# Patient Record
Sex: Female | Born: 1970 | Race: Black or African American | Hispanic: No | Marital: Single | Smoking: Never smoker
Health system: Southern US, Community
[De-identification: ages and names within clinical notes are randomized; demographics above are authoritative.]

## PROBLEM LIST (undated history)

## (undated) DIAGNOSIS — Z8679 Personal history of other diseases of the circulatory system: Secondary | ICD-10-CM

## (undated) DIAGNOSIS — E049 Nontoxic goiter, unspecified: Secondary | ICD-10-CM

## (undated) DIAGNOSIS — Z8639 Personal history of other endocrine, nutritional and metabolic disease: Secondary | ICD-10-CM

## (undated) DIAGNOSIS — L509 Urticaria, unspecified: Secondary | ICD-10-CM

## (undated) DIAGNOSIS — G932 Benign intracranial hypertension: Secondary | ICD-10-CM

## (undated) HISTORY — DX: Personal history of other endocrine, nutritional and metabolic disease: Z86.39

## (undated) HISTORY — DX: Urticaria, unspecified: L50.9

## (undated) HISTORY — PX: CYST EXCISION: SHX5701

## (undated) HISTORY — PX: TONSILLECTOMY: SUR1361

## (undated) HISTORY — PX: ABDOMINAL HYSTERECTOMY: SUR658

## (undated) HISTORY — DX: Personal history of other diseases of the circulatory system: Z86.79

## (undated) HISTORY — PX: LUMBAR PUNCTURE: SHX1985

## (undated) HISTORY — DX: Benign intracranial hypertension: G93.2

## (undated) HISTORY — DX: Nontoxic goiter, unspecified: E04.9

## (undated) HISTORY — PX: MYOMECTOMY: SHX85

---

## 2005-03-09 ENCOUNTER — Emergency Department: Payer: Self-pay | Admitting: Emergency Medicine

## 2008-09-14 ENCOUNTER — Emergency Department: Payer: Self-pay | Admitting: Emergency Medicine

## 2009-12-13 IMAGING — CR DG LUMBAR SPINE 2-3V
1 series · 4 of 4 positions shown · non-contrast
Comparison: none

REASON FOR EXAM: pain
COMMENTS:

[Series 1: view not recorded · 0.17mm/px · 4 of 4 slices shown]
[im 1/4]
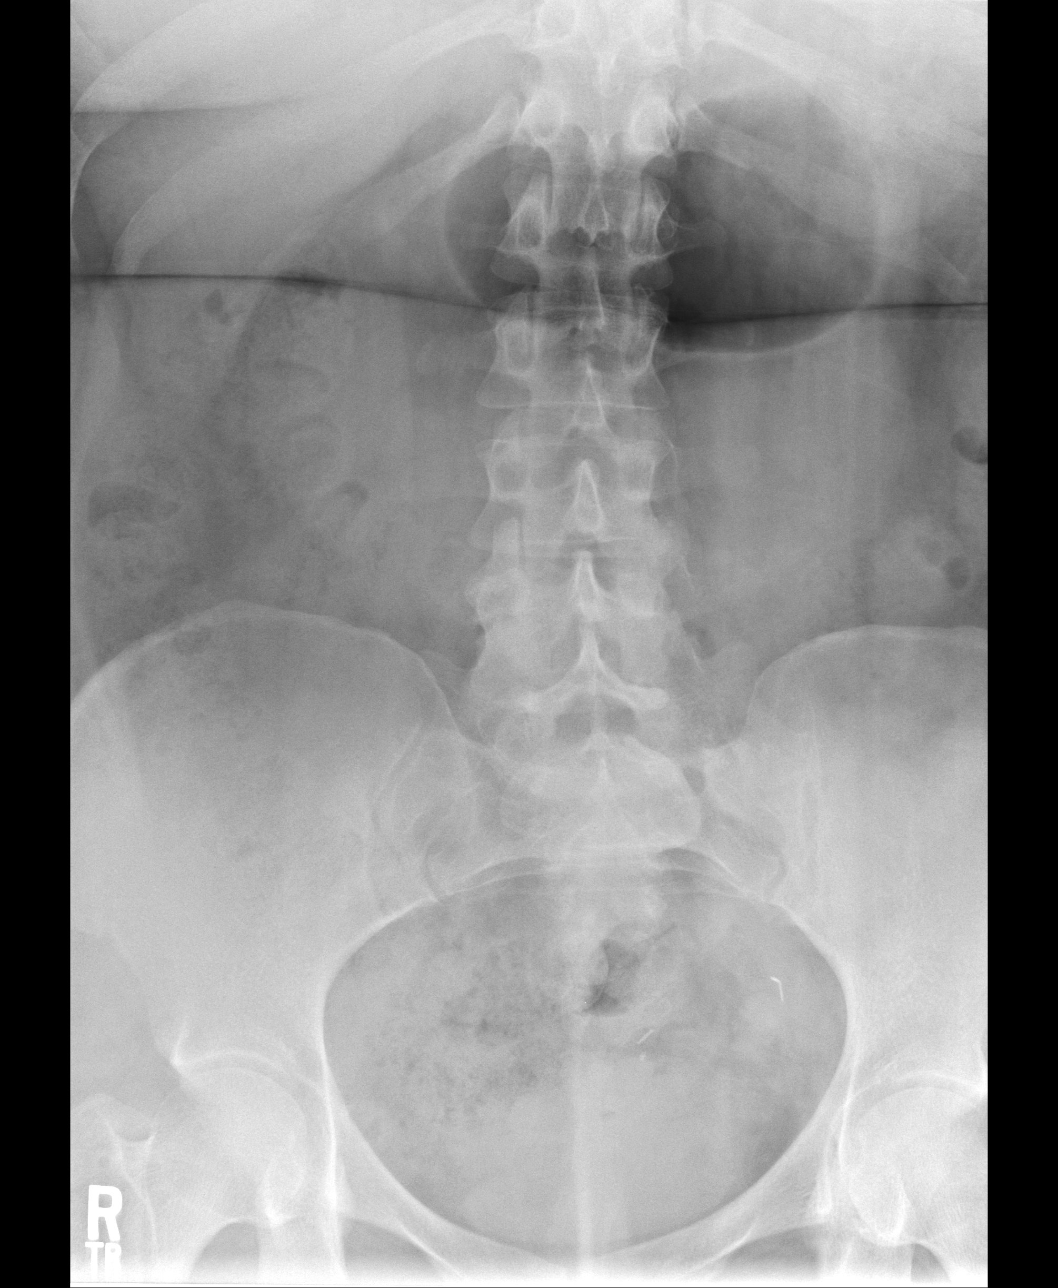
[im 2/4]
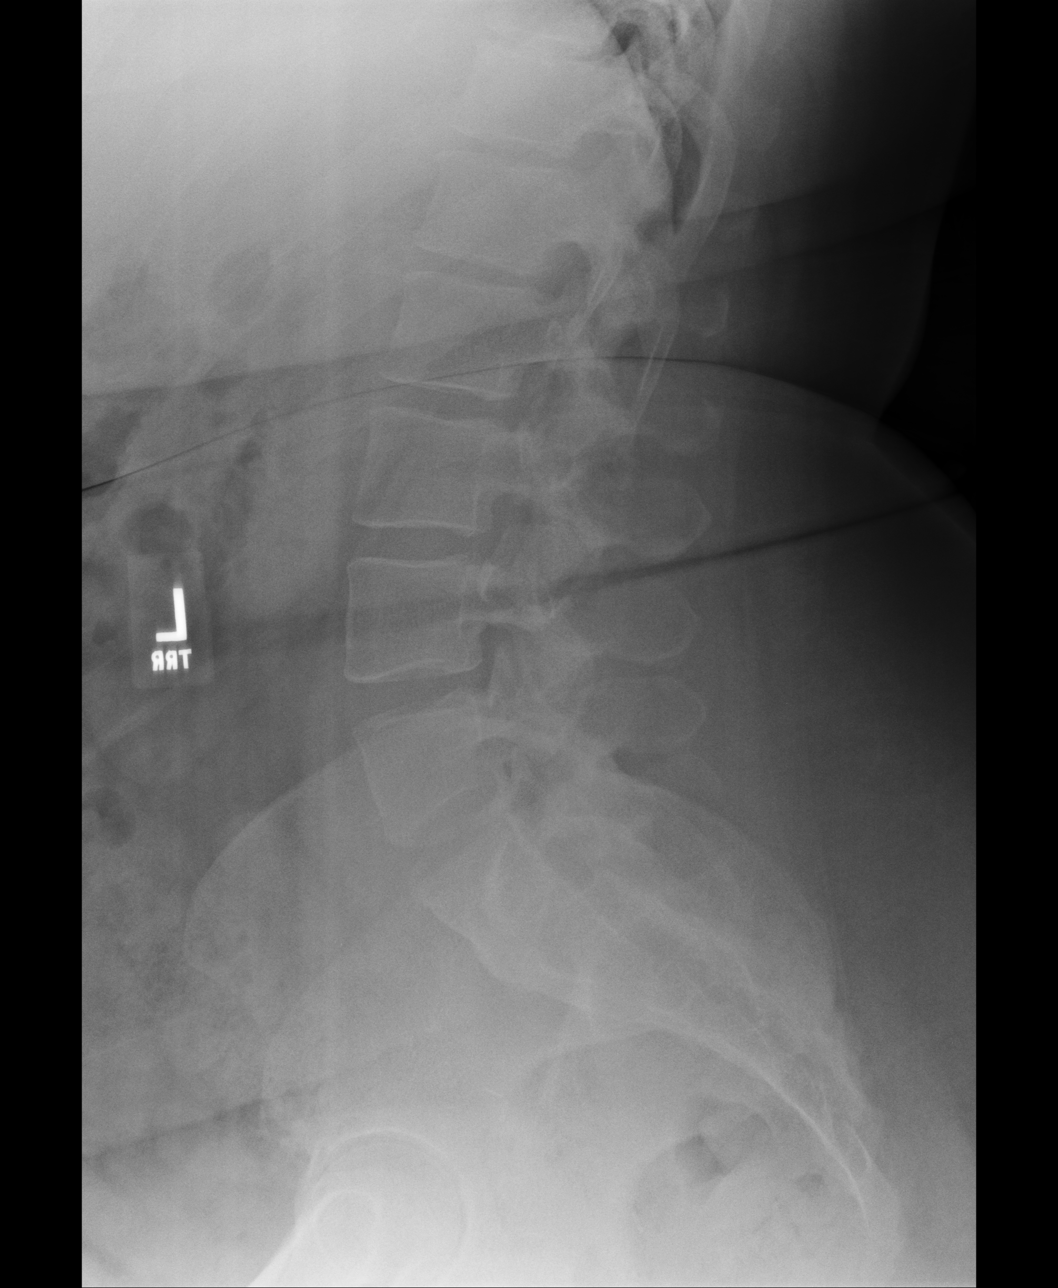
[im 3/4]
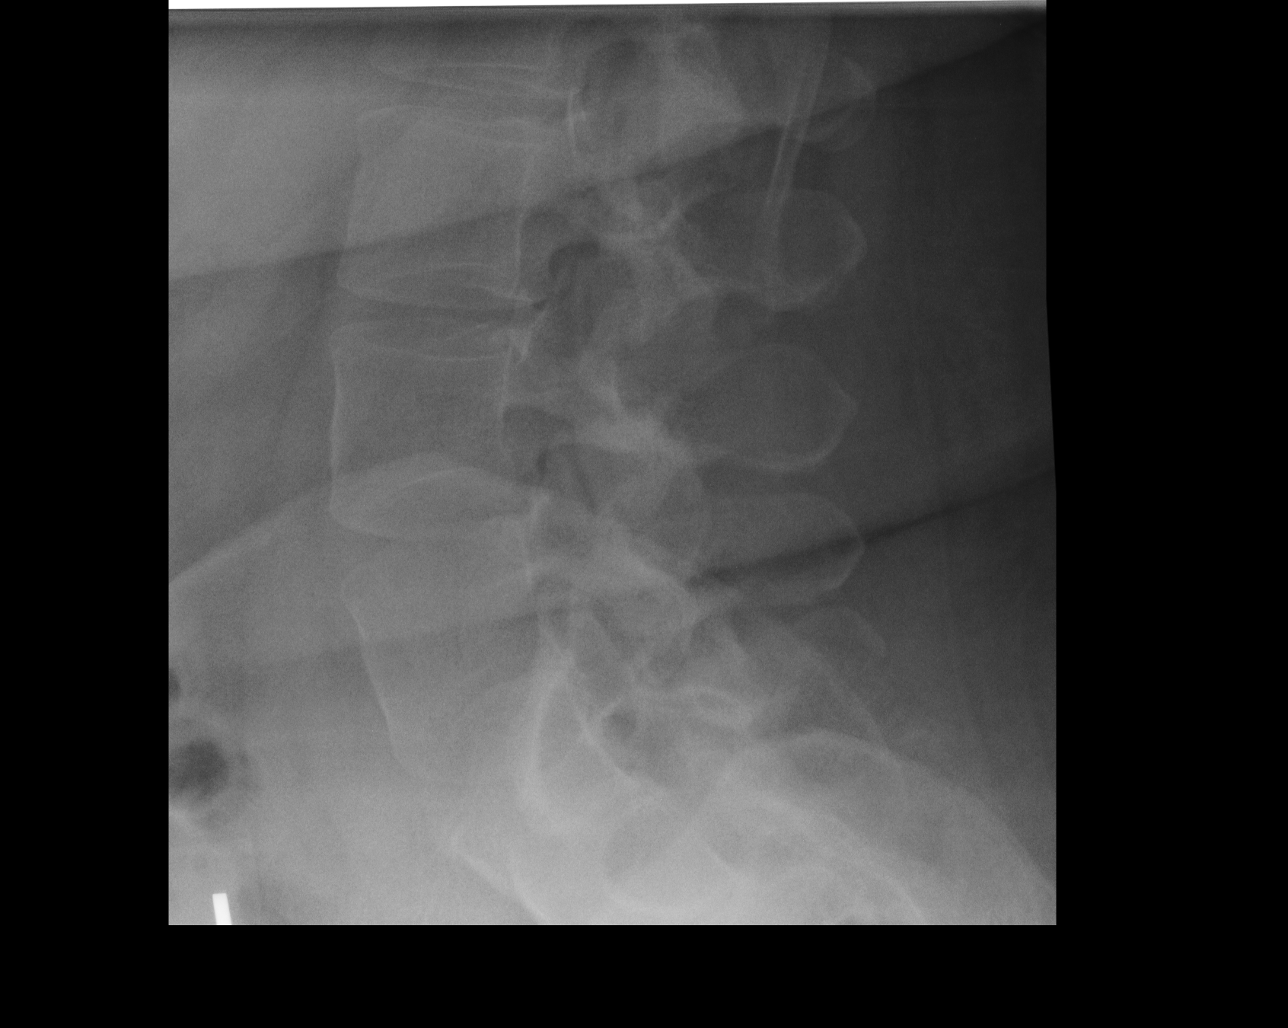
[im 4/4]
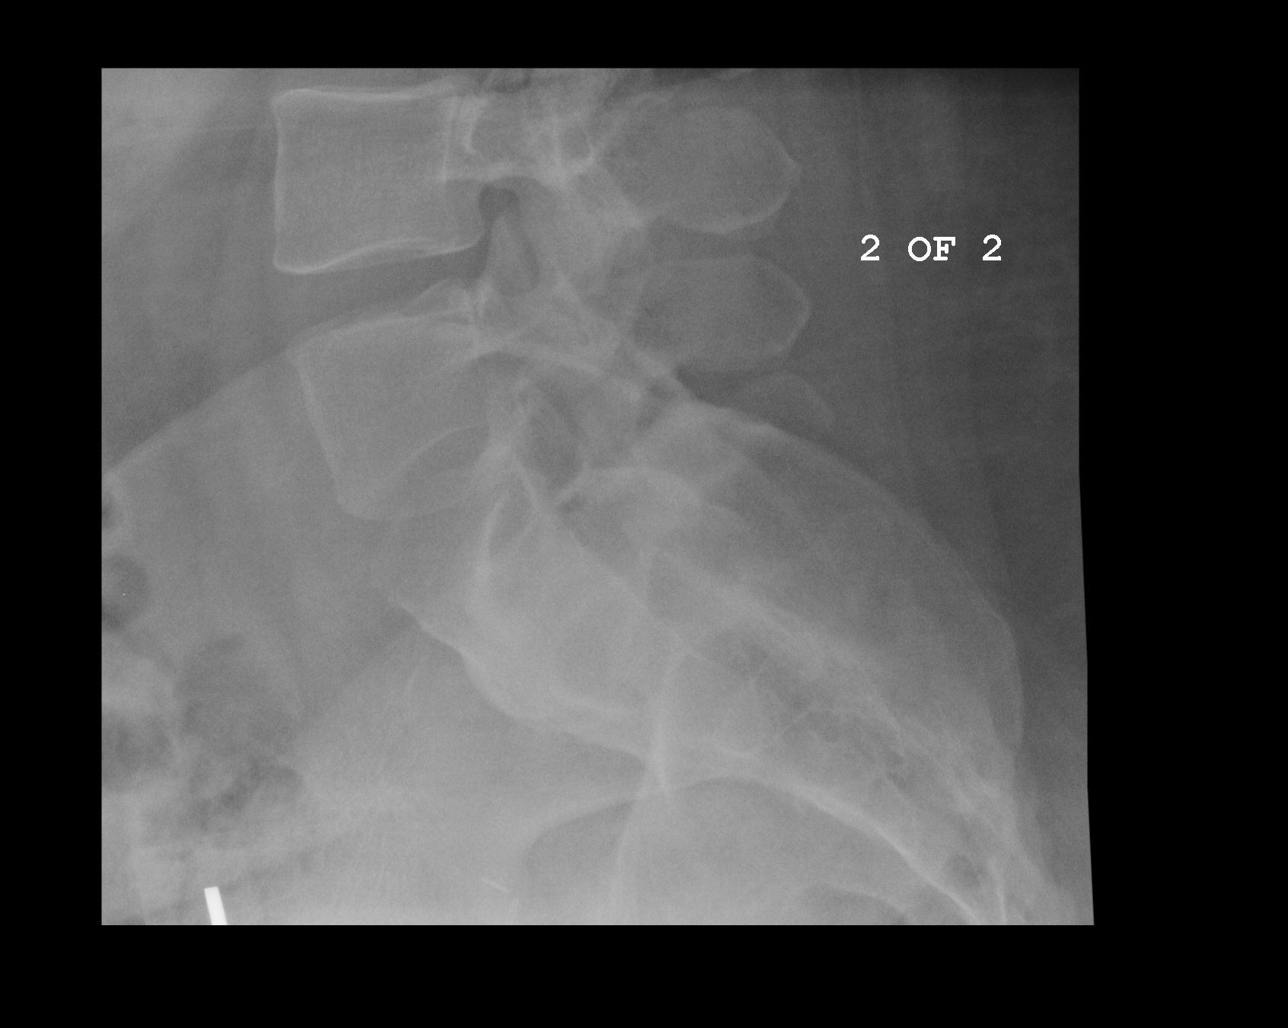

[4 of 4 positions shown; findings below may reference images not displayed]

PROCEDURE:     DXR - DXR LUMBAR SPINE AP AND LATERAL  - September 14, 2008  [DATE]

RESULT:     Five non rib bearing lumbar vertebral bodies are appreciated.
There does not appear to be evidence of fracture, dislocation or
malalignment.  Air is seen within nondilated loops of bowel.

If there is persistent clinical concern or persistent complaints of pain, a
repeat evaluation in 7-10 days is recommended if clinically warranted.
IMPRESSION: Unremarkable lumbar spine evaluation.

## 2011-02-17 ENCOUNTER — Other Ambulatory Visit: Payer: Self-pay | Admitting: *Deleted

## 2011-02-17 DIAGNOSIS — Z1231 Encounter for screening mammogram for malignant neoplasm of breast: Secondary | ICD-10-CM

## 2011-02-20 ENCOUNTER — Ambulatory Visit
Admission: RE | Admit: 2011-02-20 | Discharge: 2011-02-20 | Disposition: A | Discharge status: Home / Self Care | Payer: BC Managed Care – PPO | Source: Ambulatory Visit | Attending: *Deleted | Admitting: *Deleted

## 2011-02-20 DIAGNOSIS — Z1231 Encounter for screening mammogram for malignant neoplasm of breast: Secondary | ICD-10-CM

## 2012-12-11 ENCOUNTER — Emergency Department: Payer: Self-pay | Admitting: Unknown Physician Specialty

## 2012-12-11 LAB — COMPREHENSIVE METABOLIC PANEL
Albumin: 3.5 g/dL (ref 3.4–5.0)
Anion Gap: 6 — ABNORMAL LOW (ref 7–16)
Bilirubin,Total: 0.3 mg/dL (ref 0.2–1.0)
Calcium, Total: 8.6 mg/dL (ref 8.5–10.1)
Creatinine: 0.87 mg/dL (ref 0.60–1.30)
EGFR (Non-African Amer.): >60
Glucose: 349 mg/dL — ABNORMAL HIGH (ref 65–99)
Potassium: 4.2 mmol/L (ref 3.5–5.1)
SGOT(AST): 24 U/L (ref 15–37)

## 2012-12-11 LAB — TROPONIN I: Troponin-I: <0.02 ng/mL

## 2012-12-11 LAB — CBC
HCT: 39.2 % (ref 35.0–47.0)
HGB: 12.4 g/dL (ref 12.0–16.0)
MCHC: 31.6 g/dL — ABNORMAL LOW (ref 32.0–36.0)
MCV: 73 fL — ABNORMAL LOW (ref 80–100)
Platelet: 234 10*3/uL (ref 150–440)
RBC: 5.38 10*6/uL — ABNORMAL HIGH (ref 3.80–5.20)
RDW: 15.2 % — ABNORMAL HIGH (ref 11.5–14.5)

## 2012-12-11 LAB — URINALYSIS, COMPLETE
Bacteria: NONE SEEN
Bilirubin,UR: NEGATIVE
Glucose,UR: >=500 mg/dL (ref 0–75)
Ketone: NEGATIVE
Nitrite: NEGATIVE
Protein: NEGATIVE
RBC,UR: <1 /HPF (ref 0–5)
Specific Gravity: 1.022 (ref 1.003–1.030)

## 2012-12-11 LAB — MAGNESIUM: Magnesium: 1.6 mg/dL — ABNORMAL LOW

## 2012-12-11 LAB — LIPASE, BLOOD: Lipase: 156 U/L (ref 73–393)

## 2012-12-17 DIAGNOSIS — G4733 Obstructive sleep apnea (adult) (pediatric): Secondary | ICD-10-CM | POA: Insufficient documentation

## 2013-07-04 DIAGNOSIS — I10 Essential (primary) hypertension: Secondary | ICD-10-CM | POA: Insufficient documentation

## 2013-07-04 DIAGNOSIS — E739 Lactose intolerance, unspecified: Secondary | ICD-10-CM | POA: Insufficient documentation

## 2013-07-04 DIAGNOSIS — J309 Allergic rhinitis, unspecified: Secondary | ICD-10-CM | POA: Insufficient documentation

## 2013-07-04 DIAGNOSIS — E785 Hyperlipidemia, unspecified: Secondary | ICD-10-CM | POA: Insufficient documentation

## 2013-07-04 DIAGNOSIS — Z90710 Acquired absence of both cervix and uterus: Secondary | ICD-10-CM | POA: Insufficient documentation

## 2013-12-04 DIAGNOSIS — B001 Herpesviral vesicular dermatitis: Secondary | ICD-10-CM | POA: Insufficient documentation

## 2014-03-11 IMAGING — CR DG CHEST 2V
1 series · 2 of 2 positions shown · non-contrast
Comparison: none

REASON FOR EXAM: weakness
COMMENTS:

PROCEDURE:     DXR - DXR CHEST PA (OR AP) AND LATERAL  - December 11, 2012  [DATE]
RESULT:     Comparison: None

[Series 1: w chest pa · 0.14mm/px · 2 of 2 slices shown]
[im 1/2]
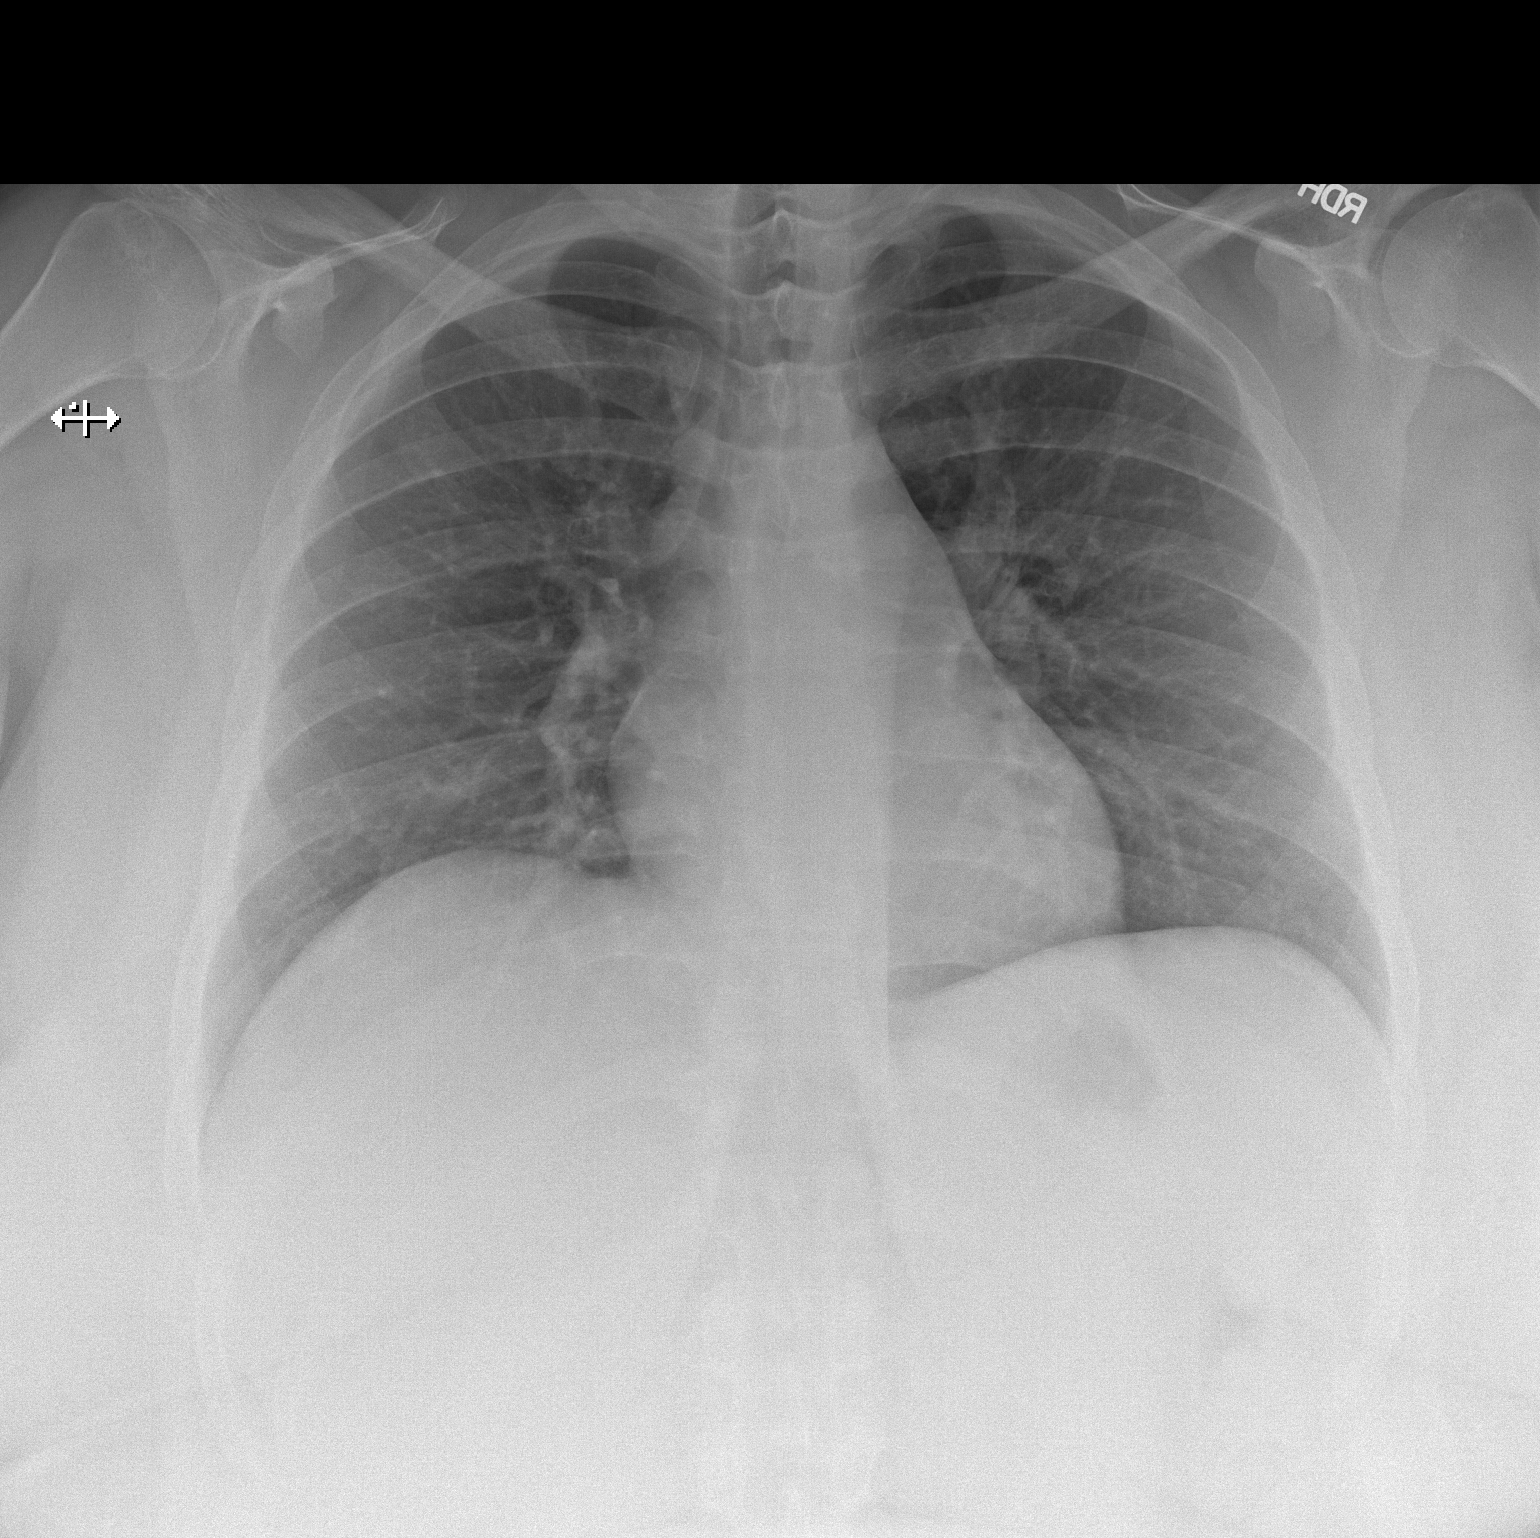
[im 2/2]
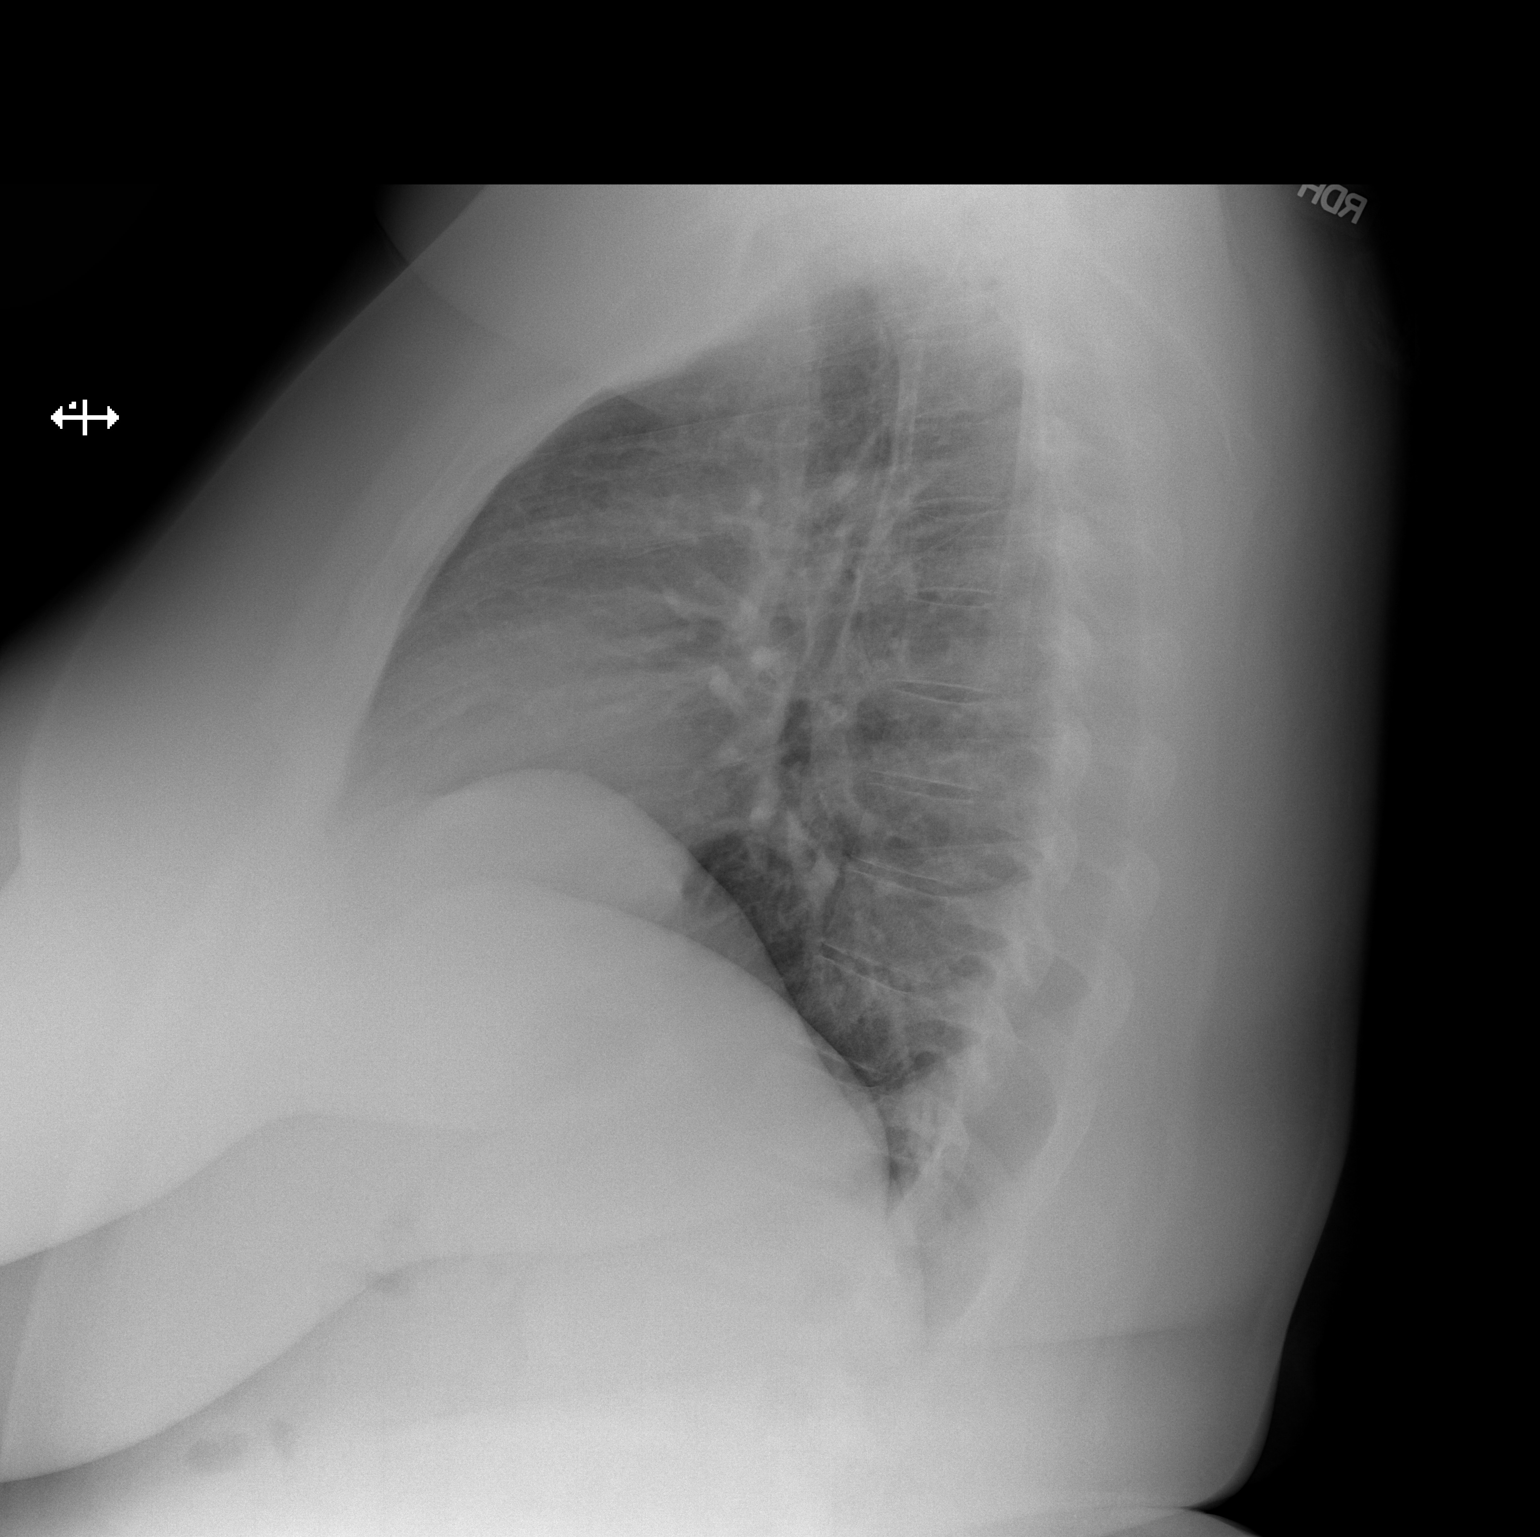

[2 of 2 positions shown; findings below may reference images not displayed]

FINDINGS: PA and lateral chest radiographs are provided.  There is no focal
parenchymal opacity, pleural effusion, or pneumothorax. The heart and
mediastinum are unremarkable.  The osseous structures are unremarkable.
IMPRESSION: No acute disease of the che[REDACTED]

## 2014-12-17 DIAGNOSIS — G932 Benign intracranial hypertension: Secondary | ICD-10-CM | POA: Insufficient documentation

## 2018-09-07 ENCOUNTER — Encounter: Payer: Self-pay | Admitting: Podiatry

## 2018-09-07 ENCOUNTER — Ambulatory Visit (INDEPENDENT_AMBULATORY_CARE_PROVIDER_SITE_OTHER): Payer: BC Managed Care – PPO

## 2018-09-07 ENCOUNTER — Other Ambulatory Visit: Payer: Self-pay | Admitting: Podiatry

## 2018-09-07 ENCOUNTER — Ambulatory Visit: Payer: BC Managed Care – PPO | Admitting: Podiatry

## 2018-09-07 DIAGNOSIS — M779 Enthesopathy, unspecified: Secondary | ICD-10-CM

## 2018-09-07 DIAGNOSIS — M205X1 Other deformities of toe(s) (acquired), right foot: Secondary | ICD-10-CM

## 2018-09-07 DIAGNOSIS — M778 Other enthesopathies, not elsewhere classified: Secondary | ICD-10-CM

## 2018-09-07 MED ORDER — MELOXICAM 15 MG PO TABS: 15.0000 mg | ORAL_TABLET | Freq: Every day | ORAL | 3 refills | Status: DC

## 2018-09-07 NOTE — Progress Notes (Signed)
Subjective:  Patient ID: Penny Fields, female    DOB: January 22, 1971,  MRN: [id number] HPI Chief Complaint  Patient presents with  . Foot Pain    patient presents today for bilat top of foot pain x 6-7 months.  She reports a shooting pain with walking and is progressivly getting worse.  She also reports her right hallux is stiff when bending it.  She has only been taking Tylenol for treatment    47 y.o. female presents with the above complaint.   ROS: Denies fever chills nausea vomiting muscle aches pains calf pain back pain chest pain shortness of breath.  No past medical history on file. No past surgical history on file.  Current Outpatient Medications:  .  aspirin EC 81 MG tablet, Take by mouth., Disp: , Rfl:  .  atorvastatin (LIPITOR) 20 MG tablet, Take by mouth., Disp: , Rfl:  .  Dextran 70-Hypromellose (ARTIFICIAL TEARS) 0.1-0.3 % SOLN, INSTILL 2 DROPS IN BOTH EYES THREE TIMES DAILY AS NEEDED FOR DRYNESS, Disp: , Rfl:  .  fexofenadine (ALLEGRA) 180 MG tablet, Take by mouth., Disp: , Rfl:  .  fluticasone (FLONASE) 50 MCG/ACT nasal spray, 2 sprays by Each Nare route daily., Disp: , Rfl:  .  liraglutide (VICTOZA) 18 MG/3ML SOPN, Inject into the skin., Disp: , Rfl:  .  lisinopril (PRINIVIL,ZESTRIL) 20 MG tablet, Take by mouth., Disp: , Rfl:  .  metformin (FORTAMET) 1000 MG (OSM) 24 hr tablet, Take by mouth., Disp: , Rfl:  .  valACYclovir (VALTREX) 1000 MG tablet, Take by mouth., Disp: , Rfl:  .  meloxicam (MOBIC) 15 MG tablet, Take 1 tablet (15 mg total) by mouth daily., Disp: 30 tablet, Rfl: 3  Allergies  Allergen Reactions  . Metronidazole Other (See Comments)    rash  . Oxycodone-Acetaminophen Rash  . Penicillins Rash   Review of Systems Objective:  There were no vitals filed for this visit.  General: Well developed, nourished, in no acute distress, alert and oriented x3   Dermatological: Skin is warm, dry and supple bilateral. Nails x 10 are well maintained; remaining  integument appears unremarkable at this time. There are no open sores, no preulcerative lesions, no rash or signs of infection present.  Vascular: Dorsalis Pedis artery and Posterior Tibial artery pedal pulses are 2/4 bilateral with immedate capillary fill time. Pedal hair growth present. No varicosities and no lower extremity edema present bilateral.   Neruologic: Grossly intact via light touch bilateral. Vibratory intact via tuning fork bilateral. Protective threshold with Semmes Wienstein monofilament intact to all pedal sites bilateral. Patellar and Achilles deep tendon reflexes 2+ bilateral. No Babinski or clonus noted bilateral.   Musculoskeletal: No gross boney pedal deformities bilateral. No pain, crepitus, or limitation noted with foot and ankle range of motion bilateral. Muscular strength 5/5 in all groups tested bilateral.  She has pain and tenderness across the dorsal aspect of the bilateral foot particularly Lisfranc's joints.  She has tenderness on end range of motion of the first metatarsophalangeal joint of the right foot limitation.  This is a hallux limitus.  Gait: Unassisted, Nonantalgic.    Radiographs:  Radiographs taken today demonstrate mild pes planus mild metatarsus adductus elongated first metatarsal some early joint space narrowing of the first metatarsophalangeal joint right foot as opposed to the left.  No acute findings noted.  Assessment & Plan:   Assessment: Hallux limitus right foot.  Midfoot osteoarthritic changes bilateral.  Plan: Discussed etiology pathology conservative surgical therapies at this point due  to the capsulitis and osteoarthritis we are requesting orthotics and I placed her on the one a day anti-inflammatory because of her elevated A1c.     Jamesyn Moorefield T. [place], [place]

## 2018-09-20 DIAGNOSIS — M205X1 Other deformities of toe(s) (acquired), right foot: Secondary | ICD-10-CM | POA: Insufficient documentation

## 2018-10-05 ENCOUNTER — Other Ambulatory Visit: Payer: BC Managed Care – PPO | Admitting: Orthotics

## 2018-10-19 ENCOUNTER — Ambulatory Visit (INDEPENDENT_AMBULATORY_CARE_PROVIDER_SITE_OTHER): Payer: BC Managed Care – PPO | Admitting: Podiatry

## 2018-10-19 ENCOUNTER — Encounter: Payer: Self-pay | Admitting: Podiatry

## 2018-10-19 DIAGNOSIS — M779 Enthesopathy, unspecified: Secondary | ICD-10-CM

## 2018-10-19 DIAGNOSIS — M778 Other enthesopathies, not elsewhere classified: Secondary | ICD-10-CM

## 2018-10-19 DIAGNOSIS — M205X1 Other deformities of toe(s) (acquired), right foot: Secondary | ICD-10-CM

## 2018-10-19 NOTE — Progress Notes (Signed)
She presents today to pick up orthotics saying that her feet are still bothering her.  Objective: No change in physical exam.  Assessment: Hallux limitus of the right foot with metatarsalgia.  Plan: She is provided with her orthotics and was provided with both oral and written home-going instructions for care and use of the orthotics.

## 2018-11-23 ENCOUNTER — Ambulatory Visit: Payer: BC Managed Care – PPO | Admitting: Podiatry

## 2018-11-23 ENCOUNTER — Encounter: Payer: Self-pay | Admitting: Podiatry

## 2018-11-23 DIAGNOSIS — M779 Enthesopathy, unspecified: Secondary | ICD-10-CM

## 2018-11-23 DIAGNOSIS — M778 Other enthesopathies, not elsewhere classified: Secondary | ICD-10-CM

## 2018-11-23 NOTE — Progress Notes (Signed)
She presents today for follow-up of her metatarsalgia and capsulitis of the lesser metatarsals and hallux limitus right foot.  She states that she is doing very well very happy with the orthotics and is much improved.  Objective: No reproducible pain on palpation today.  Pulses are palpable there is no erythema cellulitis drainage or odor.  Assessment: Well-healing metatarsalgia capsulitis lesser metatarsals right foot greater than left.  Plan: Continue the use of her orthotics.

## 2020-12-02 ENCOUNTER — Telehealth: Payer: Self-pay

## 2020-12-02 NOTE — Telephone Encounter (Signed)
I returned call to patient and informed her that she has not been seen in office in 2 years and we cannot call any pain medication in at this time.  She is scheduled for 12/23/20 with Dr. Al Corpus.  Until then I instructed her to try Advil 600mg  and alternate with ES Tylenol as needed for pain.  She did stated that today was not as painful as it had been.  She will call the office for sooner appt if pain becomes worse.  She verbalized instructions and understanding

## 2020-12-02 NOTE — Telephone Encounter (Signed)
Pt was last seen 11/23/2018 and she is having pain in her great toe. She would like to have pain meds sent to [facility]. Sending message per pts request. Appt scheduled 12/23/2020.

## 2020-12-23 ENCOUNTER — Ambulatory Visit: Payer: BC Managed Care – PPO | Admitting: Podiatry

## 2020-12-23 ENCOUNTER — Other Ambulatory Visit: Payer: Self-pay

## 2020-12-23 ENCOUNTER — Encounter: Payer: Self-pay | Admitting: Podiatry

## 2020-12-23 DIAGNOSIS — M778 Other enthesopathies, not elsewhere classified: Secondary | ICD-10-CM

## 2020-12-23 DIAGNOSIS — M109 Gout, unspecified: Secondary | ICD-10-CM | POA: Diagnosis not present

## 2020-12-23 DIAGNOSIS — M205X1 Other deformities of toe(s) (acquired), right foot: Secondary | ICD-10-CM | POA: Diagnosis not present

## 2020-12-23 MED ORDER — INDOMETHACIN 50 MG PO CAPS
50.0000 mg | ORAL_CAPSULE | Freq: Two times a day (BID) | ORAL | 2 refills | Status: DC
Start: 2020-12-23 — End: 2024-02-22

## 2020-12-23 NOTE — Progress Notes (Signed)
She presents today for follow-up of her hallux limitus capsulitis first metatarsophalangeal joint SM still having trouble with this I sometimes wear the orthotics depending on the shoes that I have.  She states that she had severe pain just the other day with severe inflammation red hot swollen joint states that her mother has a history of gout as well.  Objective: Vital signs are stable alert oriented x3.  Pulses are palpable.  No reproducible pain on palpation though she still has some postinflammatory hyperpigmentation to the medial aspect of the first metatarsophalangeal joint of the right foot.  There is no pain on range of motion of the first metatarsophalangeal joint nor on palpation.  There is noted limitation of the motion.  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  Plan: At this point we are requesting a complete metabolic panel and an arthritic panel should this come back abnormal we will notify her immediately.  I did go ahead and write her prescription for Indocin or indomethacin 50 mg tablets.  1 tablet twice daily as needed gout attack.  Should this patient request blood work such as an arthritic profile for gouty pain we are to provide this to her immediately.

## 2020-12-24 LAB — COMPREHENSIVE METABOLIC PANEL
ALT: 19 IU/L (ref 0–32)
AST: 20 IU/L (ref 0–40)
Albumin/Globulin Ratio: 1.4 (ref 1.2–2.2)
Albumin: 4.2 g/dL (ref 3.8–4.9)
Alkaline Phosphatase: 79 IU/L (ref 44–121)
BUN/Creatinine Ratio: 19 (ref 9–23)
BUN: 18 mg/dL (ref 6–24)
Bilirubin Total: <0.2 mg/dL (ref 0.0–1.2)
CO2: 21 mmol/L (ref 20–29)
Calcium: 9.4 mg/dL (ref 8.7–10.2)
Chloride: 101 mmol/L (ref 96–106)
Creatinine, Ser: 0.97 mg/dL (ref 0.57–1.00)
GFR calc Af Amer: 78 mL/min/{1.73_m2} (ref >59)
GFR calc non Af Amer: 68 mL/min/{1.73_m2} (ref >59)
Globulin, Total: 3.1 g/dL (ref 1.5–4.5)
Glucose: 100 mg/dL — ABNORMAL HIGH (ref 65–99)
Potassium: 5.1 mmol/L (ref 3.5–5.2)
Sodium: 137 mmol/L (ref 134–144)
Total Protein: 7.3 g/dL (ref 6.0–8.5)

## 2020-12-24 LAB — RHEUMATOID FACTOR: Rheumatoid fact SerPl-aCnc: <10 IU/mL (ref <14.0)

## 2020-12-24 LAB — ANA: Anti Nuclear Antibody (ANA): NEGATIVE

## 2020-12-24 LAB — SEDIMENTATION RATE: Sed Rate: 39 mm/hr (ref 0–40)

## 2020-12-24 LAB — URIC ACID: Uric Acid: 7.5 mg/dL — ABNORMAL HIGH (ref 3.0–7.2)

## 2020-12-24 LAB — C-REACTIVE PROTEIN: CRP: 9 mg/L (ref 0–10)

## 2021-01-03 ENCOUNTER — Telehealth: Payer: Self-pay

## 2021-01-03 MED ORDER — ALLOPURINOL 100 MG PO TABS: 100.0000 mg | ORAL_TABLET | Freq: Every day | ORAL | 3 refills | Status: DC

## 2021-01-03 NOTE — Telephone Encounter (Signed)
Tried to contact patient but voice mail has not been set up yet so unable to leave message.  Allopurinol has been sent into pharmacy

## 2021-01-03 NOTE — Telephone Encounter (Signed)
-----   Message from Elinor Parkinson, [place] sent at 12/24/2020  1:23 PM EST ----- Her uric acid is slightly elevated.  I think putting her on allopurinol 100mg  qd would help.  #90 with 3 rf.  Make follow up for two months after starting to see me.  We will redo blood work then. ----- Message ----- From: Interface, [facility] Lab Results In Sent: 12/24/2020   7:37 AM EST To: 12/26/2020, DPM

## 2022-11-13 NOTE — Progress Notes (Signed)
Office Visit Note  Patient: Penny Fields             Date of Birth: 05/07/1971           MRN: [id number]             PCP: Roselyn Meier, MD Referring: Dion Body, MD Visit Date: 11/27/2022 Occupation: @GUAROCC @  Subjective:  Pain in both knees  History of Present Illness: Penny Fields is a 52 y.o. female with history of osteoarthritis.  She has been seen in consultation per request of her PCP.  According the patient her symptoms started with pain and discomfort in her bilateral knee joints and 2010.  She recalls coming to see me in 2011 she states at that time we diagnosed her with osteoarthritis based on the x-rays.  Patient recalls that she had  viscosupplement injections which were helpful.  Over the next few years her symptoms were manageable.  She states gradually the symptoms have been getting worse and she is having difficulty climbing stairs.  She was having increased pain in her knee joints which improved her to some extent after defining her diet.  She also had some discomfort in her right first MTP joint for which she was seen by podiatrist.  She was advised proper fitting shoes.  None of the other joints are painful or swollen.  There is history of osteoarthritis in her both parents.  She is gravida 0, para 0.    Activities of Daily Living:  Patient reports morning stiffness for a few minutes.   Patient Denies nocturnal pain.  Difficulty dressing/grooming: Denies Difficulty climbing stairs: Denies Difficulty getting out of chair: Denies Difficulty using hands for taps, buttons, cutlery, and/or writing: Denies  Review of Systems  Constitutional:  Negative for fatigue.  HENT:  Negative for mouth sores and mouth dryness.   Eyes:  Positive for dryness.  Respiratory:  Negative for shortness of breath.   Cardiovascular:  Negative for chest pain and palpitations.  Gastrointestinal:  Negative for blood in stool, constipation and diarrhea.  Endocrine: Negative for increased  urination.  Genitourinary:  Negative for involuntary urination.  Musculoskeletal:  Positive for joint pain, joint pain and morning stiffness. Negative for gait problem, joint swelling, myalgias, muscle weakness, muscle tenderness and myalgias.  Skin:  Negative for color change, rash, hair loss and sensitivity to sunlight.  Allergic/Immunologic: Negative for susceptible to infections.  Neurological:  Negative for dizziness and headaches.  Hematological:  Negative for swollen glands.  Psychiatric/Behavioral:  Negative for depressed mood and sleep disturbance. The patient is not nervous/anxious.     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   Allergic rhinitis 07/04/2013   Essential hypertension 07/04/2013   Hyperlipidemia 07/04/2013   Lactose intolerance 07/04/2013   S/P total abdominal hysterectomy and bilateral salpingo-oophorectomy 07/04/2013   Obstructive sleep apnea 12/17/2012   Diabetes mellitus ([place]) 12/24/2009   Goiter 06/05/1997    Past Medical History:  Diagnosis Date   Goiter    History of diabetes mellitus    History of hyperlipidemia    History of hypertension    Idiopathic intracranial hypertension     Family History  Problem Relation Age of Onset   Diabetes Mother    Hypertension Mother    Kidney disease Mother    Hypertension Father  Diabetes Father    Heart failure Father    Hypertension Sister    Diabetes Sister    Past Surgical History:  Procedure Laterality Date   ABDOMINAL HYSTERECTOMY     CYST EXCISION     on back   LUMBAR PUNCTURE     MYOMECTOMY     TONSILLECTOMY     72s   Social History   Social History Narrative   Not on file   Immunization History  Administered Date(s) Administered   Influenza,inj,Quad PF,6+ Mos 08/07/2019   Influenza,inj,quad,  With Preservative 08/17/2018   Influenza-Unspecified 08/23/2014, 08/13/2016, 08/23/2017   PFIZER(Purple Top)SARS-COV-2 Vaccination 01/23/2020, 02/20/2020   Pneumococcal Polysaccharide-23 08/10/2013   Typhoid Inactivated 10/04/2006     Objective: Vital Signs: BP 132/85 (BP Location: Right Arm, Patient Position: Sitting, Cuff Size: Normal)   Pulse 80   Resp 16   Ht 5' 7.5" (1.715 m)   Wt 290 lb 12.8 oz (131.9 kg)   BMI 44.87 kg/m    Physical Exam Vitals and nursing note reviewed.  Constitutional:      Appearance: She is well-developed.  HENT:     Head: Normocephalic and atraumatic.  Eyes:     Conjunctiva/sclera: Conjunctivae normal.  Cardiovascular:     Rate and Rhythm: Normal rate and regular rhythm.     Heart sounds: Normal heart sounds.  Pulmonary:     Effort: Pulmonary effort is normal.     Breath sounds: Normal breath sounds.  Abdominal:     General: Bowel sounds are normal.     Palpations: Abdomen is soft.  Musculoskeletal:     Cervical back: Normal range of motion.  Lymphadenopathy:     Cervical: No cervical adenopathy.  Skin:    General: Skin is warm and dry.     Capillary Refill: Capillary refill takes less than 2 seconds.  Neurological:     Mental Status: She is alert and oriented to person, place, and time.  Psychiatric:        Behavior: Behavior normal.      Musculoskeletal Exam: Cervical, thoracic and lumbar spine were in good range of motion.  She had no SI joint tenderness.  Shoulder joints, elbow joints, wrist joints, MCPs PIPs and DIPs with good range of motion.  She had right first PIP thickening and some tenderness.  No synovitis was noted.  Hip joints and knee joints in good range of motion.  No warmth swelling or effusion was noted.  There was no tenderness over ankles or MTPs.  She had right hallux rigidus and PIP and DIP thickening.  No synovitis was noted.  There was no Achilles tendinitis of [place] fasciitis.  CDAI Exam: CDAI Score:  -- Patient Global: --; Provider Global: -- Swollen: --; Tender: -- Joint Exam 11/27/2022   No joint exam has been documented for this visit   There is currently no information documented on the homunculus. Go to the Rheumatology activity and complete the homunculus joint exam.  Investigation: No additional findings.  Imaging: XR KNEE 3 VIEW LEFT  Result Date: 11/27/2022 No medial or lateral compartment narrowing was noted.  No chondrocalcinosis was noted.  Moderate patellofemoral narrowing was noted. Impression: These findings are consistent with moderate patellofemoral narrowing  XR KNEE 3 VIEW RIGHT  Result Date: 11/27/2022 No medial or lateral compartment narrowing was noted.  No chondrocalcinosis was noted.  Moderate patellofemoral narrowing was noted. Impression: These findings are consistent with moderate patellofemoral narrowing   Recent Labs: Lab Results  Component Value  Date   WBC 5.9 12/11/2012   HGB 12.4 12/11/2012   PLT 234 12/11/2012   NA 137 12/23/2020   K 5.1 12/23/2020   CL 101 12/23/2020   CO2 21 12/23/2020   GLUCOSE 100 (H) 12/23/2020   BUN 18 12/23/2020   CREATININE 0.97 12/23/2020   BILITOT <0.2 12/23/2020   ALKPHOS 79 12/23/2020   AST 20 12/23/2020   ALT 19 12/23/2020   PROT 7.3 12/23/2020   ALBUMIN 4.2 12/23/2020   CALCIUM 9.4 12/23/2020   GFRAA 78 12/23/2020   December 23, 2020 uric acid 7.5, ESR 39, RF negative, ANA negative, CRP normal  Speciality Comments: No specialty comments available.  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  She states that she has seen me before in 2011 and had viscosupplement injections to her knee joints which were helpful.  Gradually the knee joint pain has returned.  She is having difficulty climbing  stairs.  She states she also noticed some inflammation in her knee joints which improved after dietary modifications.  She is also trying intentional weight loss.  She has not been able to exercise recently.  She usually walks for exercise.  No warmth swelling or effusion was noted.  Labs from December 23, 2020 were reviewed.  All autoimmune workup was negative.  Sed rate was normal.  Plan: XR KNEE 3 VIEW RIGHT, XR KNEE 3 VIEW LEFT.  X-rays of bilateral knee joints showed chondromalacia patella.  No significant osteoarthritis was noted.  X-ray findings were reviewed with the patient.  She will benefit from physical therapy.  I will refer her to physical therapy.  A handout on knee joint exercises was given.  I advised her to return for follow-up on as needed basis.  She will contact me if her symptoms get worse or she develops increased swelling.  Weight loss diet and exercise was also emphasized.  Hallux limitus of right foot-patient was evaluated by Dr. Al Corpus in the past due to feet discomfort.  She was diagnosed with hallux limitus.  Primary osteoarthritis of both feet-she had bilateral first MTP thickening and PIP and DIP thickening on the examination consistent with osteoarthritis.  Proper fitting shoes and feet exercises were discussed.  Primary osteoarthritis of both hands-she has arthritis in the right first PIP joint.  She had mild tenderness on the palpation.  Joint protection muscle strengthening was discussed.  A handout on exercises was given.  Essential hypertension-blood pressure was normal today.  History of diabetes mellitus-she is on metformin.  History of hyperlipidemia-she is on Lipitor.  Goiter  Idiopathic intracranial hypertension  Obstructive sleep apnea  S/P total abdominal hysterectomy and bilateral salpingo-oophorectomy  Orders: Orders Placed This Encounter  Procedures   XR KNEE 3 VIEW RIGHT   XR KNEE 3 VIEW LEFT   Ambulatory referral to Physical Therapy   No  orders of the defined types were placed in this encounter.    Follow-Up Instructions: Return if symptoms worsen or fail to improve, for Osteoarthritis.   Pollyann Savoy, MD  Note - This record has been created using Animal nutritionist.  Chart creation errors have been sought, but may not always  have been located. Such creation errors do not reflect on  the standard of medical care.

## 2022-11-27 ENCOUNTER — Ambulatory Visit (INDEPENDENT_AMBULATORY_CARE_PROVIDER_SITE_OTHER): Payer: BC Managed Care – PPO

## 2022-11-27 ENCOUNTER — Ambulatory Visit: Payer: BC Managed Care – PPO | Attending: Rheumatology | Admitting: Rheumatology

## 2022-11-27 ENCOUNTER — Encounter: Payer: Self-pay | Admitting: Rheumatology

## 2022-11-27 ENCOUNTER — Ambulatory Visit: Payer: BC Managed Care – PPO

## 2022-11-27 VITALS — BP 132/85 | HR 80 | Resp 16 | Ht 67.5 in | Wt 290.8 lb

## 2022-11-27 DIAGNOSIS — M2242 Chondromalacia patellae, left knee: Secondary | ICD-10-CM

## 2022-11-27 DIAGNOSIS — M2241 Chondromalacia patellae, right knee: Secondary | ICD-10-CM | POA: Diagnosis not present

## 2022-11-27 DIAGNOSIS — M19071 Primary osteoarthritis, right ankle and foot: Secondary | ICD-10-CM

## 2022-11-27 DIAGNOSIS — M170 Bilateral primary osteoarthritis of knee: Secondary | ICD-10-CM

## 2022-11-27 DIAGNOSIS — I10 Essential (primary) hypertension: Secondary | ICD-10-CM

## 2022-11-27 DIAGNOSIS — M19041 Primary osteoarthritis, right hand: Secondary | ICD-10-CM | POA: Diagnosis not present

## 2022-11-27 DIAGNOSIS — M19042 Primary osteoarthritis, left hand: Secondary | ICD-10-CM

## 2022-11-27 DIAGNOSIS — Z8639 Personal history of other endocrine, nutritional and metabolic disease: Secondary | ICD-10-CM

## 2022-11-27 DIAGNOSIS — M19072 Primary osteoarthritis, left ankle and foot: Secondary | ICD-10-CM

## 2022-11-27 DIAGNOSIS — Z90722 Acquired absence of ovaries, bilateral: Secondary | ICD-10-CM

## 2022-11-27 DIAGNOSIS — Z9079 Acquired absence of other genital organ(s): Secondary | ICD-10-CM

## 2022-11-27 DIAGNOSIS — G4733 Obstructive sleep apnea (adult) (pediatric): Secondary | ICD-10-CM

## 2022-11-27 DIAGNOSIS — G932 Benign intracranial hypertension: Secondary | ICD-10-CM

## 2022-11-27 DIAGNOSIS — E049 Nontoxic goiter, unspecified: Secondary | ICD-10-CM

## 2022-11-27 DIAGNOSIS — Z90710 Acquired absence of both cervix and uterus: Secondary | ICD-10-CM

## 2022-11-27 DIAGNOSIS — M205X1 Other deformities of toe(s) (acquired), right foot: Secondary | ICD-10-CM

## 2022-11-27 NOTE — Patient Instructions (Signed)
Exercises for Chronic Knee Pain Chronic knee pain is pain that lasts longer than 3 months. For most people with chronic knee pain, exercise and weight loss is an important part of treatment. Your health care provider may want you to focus on: Strengthening the muscles that support your knee. This can take pressure off your knee and lessen pain. Preventing knee stiffness. Maintaining or increasing how far you can move your knee. Losing weight (if this applies) to take pressure off your knee, decrease your risk for injury, and make it easier for you to exercise. Your health care provider will help you develop an exercise program that matches your needs and physical abilities. Below are simple, low-impact exercises you can do at home. Ask your health care provider or a physical therapist how often you should do your exercise program and how many times to repeat each exercise. General safety tips Follow these safety tips for exercising with chronic knee pain: Get your health care provider's approval before doing any exercises. Start slowly and stop any time an exercise causes pain. Do not exercise if your knee pain is flaring up. Warm up first. Stretching a cold muscle can cause an injury. Do 5-10 minutes of easy movement or light stretching before beginning your exercise routine. Do 5-10 minutes of low-impact activity (like walking or cycling) before starting strengthening exercises. Contact your health care provider any time you have pain during or after exercising. Exercise may cause discomfort but should not be painful. It is normal to be a little stiff or sore after exercising.  Stretching and range-of-motion exercises Front thigh stretch  Stand up straight and support your body by holding on to a chair or resting one hand on a wall. With your legs straight and close together, bend one knee to lift your heel up toward your buttocks. Using one hand for support, grab your ankle with your free  hand. Pull your foot up closer toward your buttocks to feel the stretch in front of your thigh. Hold the stretch for 30 seconds. Repeat __________ times. Complete this exercise __________ times a day. Back thigh stretch  Sit on the floor with your back straight and your legs out straight in front of you. Place the palms of your hands on the floor and slide them toward your feet as you bend at the hip. Try to touch your nose to your knees and feel the stretch in the back of your thighs. Hold for 30 seconds. Repeat __________ times. Complete this exercise __________ times a day. Calf stretch  Stand facing a wall. Place the palms of your hands flat against the wall, arms extended, and lean slightly against the wall. Get into a lunge position with one leg bent at the knee and the other leg stretched out straight behind you. Keep both feet facing the wall and increase the bend in your knee while keeping the heel of the other leg flat on the ground. You should feel the stretch in your calf. Hold for 30 seconds. Repeat __________ times. Complete this exercise __________ times a day. Strengthening exercises Straight leg lift Lie on your back with one knee bent and the other leg out straight. Slowly lift the straight leg without bending the knee. Lift until your foot is about 12 inches (30 cm) off the floor. Hold for 3-5 seconds and slowly lower your leg. Repeat __________ times. Complete this exercise __________ times a day. Single leg dip Stand between two chairs and put both hands on the  backs of the chairs for support. Extend one leg out straight with your body weight resting on the heel of the standing leg. Slowly bend your standing knee to dip your body to the level that is comfortable for you. Hold for 3-5 seconds. Repeat __________ times. Complete this exercise __________ times a day. Hamstring curls Stand straight, knees close together, facing the back of a chair. Hold on to the  back of a chair with both hands. Keep one leg straight. Bend the other knee while bringing the heel up toward the buttock until the knee is bent at a 90-degree angle (right angle). Hold for 3-5 seconds. Repeat __________ times. Complete this exercise __________ times a day. Wall squat Stand straight with your back, hips, and head against a wall. Step forward one foot at a time with your back still against the wall. Your feet should be 2 feet (61 cm) from the wall at shoulder width. Keeping your back, hips, and head against the wall, slide down the wall to as close of a sitting position as you can get. Hold for 5-10 seconds, then slowly slide back up. Repeat __________ times. Complete this exercise __________ times a day. Step-ups Step up with one foot onto a sturdy platform or stool that is about 6 inches (15 cm) high. Face sideways with one foot on the platform and one on the ground. Place all your weight on the platform foot and lift your body off the ground until your knee extends. Let your other leg hang free to the side. Hold for 3-5 seconds then slowly lower your weight down to the floor foot. Repeat __________ times. Complete this exercise __________ times a day. Contact a health care provider if: Your exercise causes pain. Your pain is worse after you exercise. Your pain prevents you from doing your exercises. This information is not intended to replace advice given to you by your health care provider. Make sure you discuss any questions you have with your health care provider. Document Revised: 02/29/2020 Document Reviewed: 10/23/2019 [employer] Patient Education  2023 [employer]  Hand Exercises Hand exercises can be helpful for almost anyone. These exercises can strengthen the hands, improve flexibility and movement, and increase blood flow to the hands. These results can make work and daily tasks easier. Hand exercises can be especially helpful for people who have joint pain  from arthritis or have nerve damage from overuse (carpal tunnel syndrome). These exercises can also help people who have injured a hand. Exercises Most of these hand exercises are gentle stretching and motion exercises. It is usually safe to do them often throughout the day. Warming up your hands before exercise may help to reduce stiffness. You can do this with gentle massage or by placing your hands in warm water for 10-15 minutes. It is normal to feel some stretching, pulling, tightness, or mild discomfort as you begin new exercises. This will gradually improve. Stop an exercise right away if you feel sudden, severe pain or your pain gets worse. Ask your health care provider which exercises are best for you. Knuckle bend or "claw" fist  Stand or sit with your arm, hand, and all five fingers pointed straight up. Make sure to keep your wrist straight during the exercise. Gently bend your fingers down toward your palm until the tips of your fingers are touching the top of your palm. Keep your big knuckle straight and just bend the small knuckles in your fingers. Hold this position for __________ seconds. Straighten (  extend) your fingers back to the starting position. Repeat this exercise 5-10 times with each hand. Full finger fist  Stand or sit with your arm, hand, and all five fingers pointed straight up. Make sure to keep your wrist straight during the exercise. Gently bend your fingers into your palm until the tips of your fingers are touching the middle of your palm. Hold this position for __________ seconds. Extend your fingers back to the starting position, stretching every joint fully. Repeat this exercise 5-10 times with each hand. Straight fist Stand or sit with your arm, hand, and all five fingers pointed straight up. Make sure to keep your wrist straight during the exercise. Gently bend your fingers at the big knuckle, where your fingers meet your hand, and the middle knuckle. Keep  the knuckle at the tips of your fingers straight and try to touch the bottom of your palm. Hold this position for __________ seconds. Extend your fingers back to the starting position, stretching every joint fully. Repeat this exercise 5-10 times with each hand. Tabletop  Stand or sit with your arm, hand, and all five fingers pointed straight up. Make sure to keep your wrist straight during the exercise. Gently bend your fingers at the big knuckle, where your fingers meet your hand, as far down as you can while keeping the small knuckles in your fingers straight. Think of forming a tabletop with your fingers. Hold this position for __________ seconds. Extend your fingers back to the starting position, stretching every joint fully. Repeat this exercise 5-10 times with each hand. Finger spread  Place your hand flat on a table with your palm facing down. Make sure your wrist stays straight as you do this exercise. Spread your fingers and thumb apart from each other as far as you can until you feel a gentle stretch. Hold this position for __________ seconds. Bring your fingers and thumb tight together again. Hold this position for __________ seconds. Repeat this exercise 5-10 times with each hand. Making circles  Stand or sit with your arm, hand, and all five fingers pointed straight up. Make sure to keep your wrist straight during the exercise. Make a circle by touching the tip of your thumb to the tip of your index finger. Hold for __________ seconds. Then open your hand wide. Repeat this motion with your thumb and each finger on your hand. Repeat this exercise 5-10 times with each hand. Thumb motion  Sit with your forearm resting on a table and your wrist straight. Your thumb should be facing up toward the ceiling. Keep your fingers relaxed as you move your thumb. Lift your thumb up as high as you can toward the ceiling. Hold for __________ seconds. Bend your thumb across your palm as far  as you can, reaching the tip of your thumb for the small finger (pinkie) side of your palm. Hold for __________ seconds. Repeat this exercise 5-10 times with each hand. Grip strengthening  Hold a stress ball or other soft ball in the middle of your hand. Slowly increase the pressure, squeezing the ball as much as you can without causing pain. Think of bringing the tips of your fingers into the middle of your palm. All of your finger joints should bend when doing this exercise. Hold your squeeze for __________ seconds, then relax. Repeat this exercise 5-10 times with each hand. Contact a health care provider if: Your hand pain or discomfort gets much worse when you do an exercise. Your hand pain or   discomfort does not improve within 2 hours after you exercise. If you have any of these problems, stop doing these exercises right away. Do not do them again unless your health care provider says that you can. Get help right away if: You develop sudden, severe hand pain or swelling. If this happens, stop doing these exercises right away. Do not do them again unless your health care provider says that you can. This information is not intended to replace advice given to you by your health care provider. Make sure you discuss any questions you have with your health care provider. Document Revised: 02/13/2021 Document Reviewed: 02/13/2021 Elsevier Patient Education  2023 [employer]  

## 2022-12-18 ENCOUNTER — Ambulatory Visit: Payer: Self-pay | Admitting: Rheumatology

## 2023-01-15 ENCOUNTER — Encounter: Payer: Self-pay | Admitting: Physical Therapy

## 2023-01-15 ENCOUNTER — Ambulatory Visit: Payer: BC Managed Care – PPO | Attending: Rheumatology | Admitting: Physical Therapy

## 2023-01-15 DIAGNOSIS — M2241 Chondromalacia patellae, right knee: Secondary | ICD-10-CM | POA: Diagnosis not present

## 2023-01-15 DIAGNOSIS — M25662 Stiffness of left knee, not elsewhere classified: Secondary | ICD-10-CM | POA: Insufficient documentation

## 2023-01-15 DIAGNOSIS — M2242 Chondromalacia patellae, left knee: Secondary | ICD-10-CM | POA: Diagnosis not present

## 2023-01-15 DIAGNOSIS — M25661 Stiffness of right knee, not elsewhere classified: Secondary | ICD-10-CM | POA: Diagnosis present

## 2023-01-15 NOTE — Therapy (Signed)
OUTPATIENT PHYSICAL THERAPY LOWER EXTREMITY EVALUATION   Patient Name: Penny Fields MRN: [id number] DOB:06/16/1971, 52 y.o., female Today's Date: 01/15/2023  END OF SESSION:  PT End of Session - 01/15/23 0926     Visit Number 1    Number of Visits 17    Date for PT Re-Evaluation 03/19/23    Authorization - Visit Number 1    Authorization - Number of Visits 10    Progress Note Due on Visit 10    PT Start Time 0925    PT Stop Time 1000    PT Time Calculation (min) 35 min    Activity Tolerance Patient tolerated treatment well    Behavior During Therapy WFL for tasks assessed/performed             Past Medical History:  Diagnosis Date   Goiter    History of diabetes mellitus    History of hyperlipidemia    History of hypertension    Idiopathic intracranial hypertension    Past Surgical History:  Procedure Laterality Date   ABDOMINAL HYSTERECTOMY     CYST EXCISION     on back   LUMBAR PUNCTURE     MYOMECTOMY     TONSILLECTOMY     20s   Patient Active Problem List   Diagnosis Date Noted   Hallux limitus of right foot 09/20/2018   Idiopathic intracranial hypertension 12/17/2014   Morbid obesity ([place]) 12/17/2014   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   Allergic rhinitis 07/04/2013   Essential hypertension 07/04/2013   Hyperlipidemia 07/04/2013   Lactose intolerance 07/04/2013   S/P total abdominal hysterectomy and bilateral salpingo-oophorectomy 07/04/2013   Obstructive sleep apnea 12/17/2012   Diabetes mellitus ([place]) 12/24/2009   Goiter 06/05/1997    PCP: Daryl Eastern MD  REFERRING PROVIDER: Estanislado Pandy MD  REFERRING DIAG: Bilat knee pain   THERAPY DIAG:  Stiffness of left knee, not elsewhere classified  Stiffness of right knee, not elsewhere classified  Rationale for Evaluation and Treatment: Rehabilitation  ONSET DATE: 2020  SUBJECTIVE:   SUBJECTIVE STATEMENT: Bilat knee pain  PERTINENT HISTORY: Pt is a 52 year old presenting with bilat knee pain over the  past "several years". She feels like mostly she feels like her legs are not as strong as they should pain. She feels like when she squats she doesn't have enough strength to raise back up from that position. Pt reports strength is more of an issue than pain, but does have 3/10 pain with stair ambulation, and squatting. Pain is aching, isolated to knees, no n/t. Pt has started going to the gym and is having trouble with a gym routine due to knee pain. She has also started playing pickleball 1x/week and would like to be able to participate in this. Pt works full time in [employer], and reports this is mostly desk work. But does report having to move from her desk every hour or so. No falls in past 25mo. Pt denies N/V, B&B changes, unexplained weight fluctuation, saddle paresthesia, fever, night sweats, or unrelenting night pain at this time.    PAIN:  Are you having pain? Yes: NPRS scale: 0/10 Pain location: bilat knees Pain description: aching Aggravating factors: stairs, prolonged sitting, squatting Relieving factors: rest  PRECAUTIONS: None  WEIGHT BEARING RESTRICTIONS: No  FALLS:  Has patient fallen in last 6 months? 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  PLOF: Independent  PATIENT GOALS: Get stronger  NEXT MD VISIT: none scheduled  OBJECTIVE:   DIAGNOSTIC FINDINGS: Xray 11/27/22 bilat knees  PATIENT SURVEYS:  FOTO 57 goal 46  COGNITION: Overall cognitive status: Within functional limits for tasks assessed     SENSATION: WFL  EDEMA:  None  MUSCLE LENGTH: Hamstrings: shortened bilat   POSTURE: No Significant postural limitations, rounded shoulders, and increased thoracic kyphosis  PALPATION: No TTP  LOWER EXTREMITY ROM:  Hip mobility grossly WNL bilat Bilat knee flex approx 110d fascial  restrictions; ext to 0d bilat  LOWER EXTREMITY MMT:  MMT Right eval Left eval  Hip flexion 5 5  Hip extension 4+ 4+  Hip abduction 5 5  Hip adduction    Hip internal rotation 5 5  Hip external rotation 4+ 4+  Knee flexion 5 5  Knee extension 5 5  Ankle dorsiflexion 5 5  Ankle plantarflexion    Ankle inversion    Ankle eversion     (Blank rows = not tested)   LOWER EXTREMITY SPECIAL TESTS:  Knee special tests: Apley's test: negative, McMurray's test: negative, and Thessaly test: negative  FUNCTIONAL TESTS:  5 times sit to stand: 14sec 10 meter walk test: self selected: 1.7ms    GAIT: Distance walked: 50M Assistive device utilized: none  Level of assistance: Complete Independence Comments: normal gait; normal speed   TODAY'S TREATMENT:                                                                                                                              DATE: 01/15/23 PT reviewed the following HEP with patient with patient able to demonstrate a set of the following with min cuing for correction needed. PT educated patient on parameters of therex (how/when to inc/decrease intensity, frequency, rep/set range, stretch hold time, and purpose of therex) with verbalized understanding.   Access Code: [email or web address]  - 1-2 x weekly - 3 sets - 8-12 reps - Kettlebell Deadlift  - 1-2 x weekly - 3 sets - 8-12 reps - Single Leg Bridge  - 1-2 x weekly - 3 sets - 6-12 reps - Standard Lunge  - 1-2 x weekly - 3 sets - 12-20 reps    PATIENT EDUCATION:  Education details: Patient was educated on diagnosis, anatomy and pathology involved, prognosis, role of PT, and was given an HEP, demonstrating exercise with proper form following verbal and tactile cues, and was given a paper hand out to continue exercise at home. Pt was educated on and agreed to plan of care.  Person educated: Patient Education method: Explanation, Demonstration, Verbal cues, and  Handouts Education comprehension: verbalized understanding, returned demonstration, and verbal cues required  HOME EXERCISE PROGRAM: [id number] ASSESSMENT:  CLINICAL IMPRESSION: Patient is a 52y.o. female who was seen today for physical therapy evaluation and treatment for bilat knee pain. Impairments in decreased active closed chain mobility,  decreased hip ext and core strength, and pain. 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 Would benefit from skilled PT to address above deficits and promote optimal return to PLOF.    OBJECTIVE IMPAIRMENTS: decreased activity tolerance, decreased ROM, decreased strength, increased fascial restrictions, impaired flexibility, improper body mechanics, postural dysfunction, obesity, and pain.   ACTIVITY LIMITATIONS: carrying, lifting, sitting, standing, squatting, stairs, transfers, and locomotion level  PARTICIPATION LIMITATIONS: cleaning, driving, shopping, community activity, and occupation  PERSONAL FACTORS: Fitness, Past/current experiences, Time since onset of injury/illness/exacerbation, and 1-2 comorbidities: HTN, obesity are also affecting patient's functional outcome.   REHAB POTENTIAL: Good  CLINICAL DECISION MAKING: Stable/uncomplicated  EVALUATION COMPLEXITY: Low   GOALS: Goals reviewed with patient? Yes  SHORT TERM GOALS: Target date: 02/17/23  Pt will be independent with HEP in order to improve strength and balance in order to decrease fall risk and improve function at home and work. Baseline: HEP given  Goal status: INITIAL   LONG TERM GOALS: Target date: 03/19/23  Patient will increase FOTO score to 79 to demonstrate predicted increase in functional mobility to complete ADLs Baseline:  Goal status: INITIAL  2.  Pt will be able to perform deep squat to lift 30# without pain  in order to demonstrate ability to complete heavy household chores Baseline: unable to squat to deep; pain 3/10 Goal status: INITIAL  3.  Pt will demonstrate 5xSTS time of 10sec or less to demonstrate clinically significant age matched strengh norms Baseline: 14sec Goal status: INITIAL   PLAN:  PT FREQUENCY: 1-2x/week  PT DURATION: 8 weeks  PLANNED INTERVENTIONS: Therapeutic exercises, Therapeutic activity, Neuromuscular re-education, Balance training, Gait training, Patient/Family education, Self Care, Joint mobilization, Joint manipulation, Stair training, DME instructions, Dry Needling, Electrical stimulation, Spinal manipulation, Spinal mobilization, Cryotherapy, Moist heat, Traction, Ultrasound, Fluidotherapy, Manual therapy, and Re-evaluation  PLAN FOR NEXT SESSION: plank test  Durwin Reges DPT Durwin Reges, PT 01/15/2023, 10:13 AM

## 2023-01-22 ENCOUNTER — Encounter: Payer: Self-pay | Admitting: Physical Therapy

## 2023-01-22 ENCOUNTER — Ambulatory Visit: Payer: BC Managed Care – PPO | Admitting: Physical Therapy

## 2023-01-22 DIAGNOSIS — M25661 Stiffness of right knee, not elsewhere classified: Secondary | ICD-10-CM

## 2023-01-22 DIAGNOSIS — M25662 Stiffness of left knee, not elsewhere classified: Secondary | ICD-10-CM

## 2023-01-22 NOTE — Therapy (Signed)
OUTPATIENT PHYSICAL THERAPY LOWER EXTREMITY EVALUATION   Patient Name: Penny Fields MRN: [id number] DOB:[birth year], 52 y.o., female Today's Date: 01/22/2023  END OF SESSION:  PT End of Session - 01/22/23 0831     Visit Number 2    Number of Visits 17    Date for PT Re-Evaluation 03/19/23    Authorization - Visit Number 2    Authorization - Number of Visits 10    Progress Note Due on Visit 10    PT Start Time 0830    PT Stop Time 0910    PT Time Calculation (min) 40 min    Activity Tolerance Patient tolerated treatment well    Behavior During Therapy WFL for tasks assessed/performed              Past Medical History:  Diagnosis Date   Goiter    History of diabetes mellitus    History of hyperlipidemia    History of hypertension    Idiopathic intracranial hypertension    Past Surgical History:  Procedure Laterality Date   ABDOMINAL HYSTERECTOMY     CYST EXCISION     on back   LUMBAR PUNCTURE     MYOMECTOMY     TONSILLECTOMY     20s   Patient Active Problem List   Diagnosis Date Noted   Hallux limitus of right foot 09/20/2018   Idiopathic intracranial hypertension 12/17/2014   Morbid obesity ([place]) 12/17/2014   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   Allergic rhinitis 07/04/2013   Essential hypertension 07/04/2013   Hyperlipidemia 07/04/2013   Lactose intolerance 07/04/2013   S/P total abdominal hysterectomy and bilateral salpingo-oophorectomy 07/04/2013   Obstructive sleep apnea 12/17/2012   Diabetes mellitus ([place]) 12/24/2009   Goiter 06/05/1997    PCP: Daryl Eastern MD  REFERRING PROVIDER: Estanislado Pandy MD  REFERRING DIAG: Bilat knee pain   THERAPY DIAG:  Stiffness of left knee, not elsewhere classified  Stiffness of right knee, not elsewhere classified  Rationale for Evaluation and Treatment: Rehabilitation  ONSET DATE: 2020  SUBJECTIVE:   SUBJECTIVE STATEMENT: Pt reports no pain this am. She reports she is doing well with gym program, and is completing  the elliptical and stair stepper without issue.   PERTINENT HISTORY: Pt is a 52 year old presenting with bilat knee pain over the past "several years". She feels like mostly she feels like her legs are not as strong as they should pain. She feels like when she squats she doesn't have enough strength to raise back up from that position. Pt reports strength is more of an issue than pain, but does have 3/10 pain with stair ambulation, and squatting. Pain is aching, isolated to knees, no n/t. Pt has started going to the gym and is having trouble with a gym routine due to knee pain. She has also started playing pickleball 1x/week and would like to be able to participate in this. Pt works full time in [employer], and reports this is mostly desk work. But does report having to move from her desk every hour or so. No falls in past 13mos. Pt denies N/V, B&B changes, unexplained weight fluctuation, saddle paresthesia, fever, night sweats, or unrelenting night pain at this time.    PAIN:  Are you having pain? Yes: NPRS scale: 0/10 Pain location: bilat knees Pain description: aching Aggravating factors: stairs, prolonged sitting, squatting Relieving factors: rest  PRECAUTIONS: None  WEIGHT BEARING RESTRICTIONS: No  FALLS:  Has patient fallen in last 6 months? 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  PLOF: Independent  PATIENT GOALS: Get stronger  NEXT MD VISIT: none scheduled  OBJECTIVE:   DIAGNOSTIC FINDINGS: Xray 11/27/22 bilat knees  PATIENT SURVEYS:  FOTO 57 goal 81  COGNITION: Overall cognitive status: Within functional limits for tasks assessed     SENSATION: WFL  EDEMA:  None  MUSCLE LENGTH: Hamstrings: shortened bilat   POSTURE: No Significant postural limitations, rounded shoulders, and increased  thoracic kyphosis  PALPATION: No TTP  LOWER EXTREMITY ROM:  Hip mobility grossly WNL bilat Bilat knee flex approx 110d fascial restrictions; ext to 0d bilat  LOWER EXTREMITY MMT:  MMT Right eval Left eval  Hip flexion 5 5  Hip extension 4+ 4+  Hip abduction 5 5  Hip adduction    Hip internal rotation 5 5  Hip external rotation 4+ 4+  Knee flexion 5 5  Knee extension 5 5  Ankle dorsiflexion 5 5  Ankle plantarflexion    Ankle inversion    Ankle eversion     (Blank rows = not tested)   LOWER EXTREMITY SPECIAL TESTS:  Knee special tests: Apley's test: negative, McMurray's test: negative, and Thessaly test: negative  FUNCTIONAL TESTS:  5 times sit to stand: 14sec 10 meter walk test: self selected: 1.21m/s    GAIT: Distance walked: 11M Assistive device utilized:  none  Level of assistance: Complete Independence Comments: normal gait; normal speed   TODAY'S TREATMENT:                                                                                                                              DATE: 01/15/23 Nustep seat seat 11 UE 16 L3 40mins for gentle rotation and strengthening Sumo squat 30# KB 2x 10 with min cuing for technique to "keep heels on the floor" with good carry over KB deadlift 30# KB x10; 40# KB x10 with excellent carry over of initial cuing/demo Walking lunge 2x 109ft 50% depth with good carry over of demo  SL RDL to cone touch 2 x 10 bilat with good carry over of demo    PATIENT EDUCATION:  Education details: Patient was educated on diagnosis, anatomy and pathology involved, prognosis, role of PT, and was given an HEP, demonstrating exercise with proper form following verbal and tactile cues, and was given a paper hand out to continue exercise at home. Pt was educated on and agreed to plan of care.  Person educated: Patient Education method: Explanation, Demonstration, Verbal cues, and Handouts Education comprehension: verbalized understanding, returned  demonstration, and verbal cues required  HOME EXERCISE PROGRAM: [id number]  ASSESSMENT:  CLINICAL IMPRESSION: PT initiated therex progression for increased BLE and core strengthening with success. Patient is able to comply with all cuing for proper technique of therex with good motivation and no increased pain throughout session. PT will  continue progression as able.     OBJECTIVE IMPAIRMENTS: decreased activity tolerance, decreased ROM, decreased strength, increased fascial restrictions, impaired flexibility, improper body mechanics, postural dysfunction, obesity, and pain.   ACTIVITY LIMITATIONS: carrying, lifting, sitting, standing, squatting, stairs, transfers, and locomotion level  PARTICIPATION LIMITATIONS: cleaning, driving, shopping, community activity, and occupation  PERSONAL FACTORS: Fitness, Past/current experiences, Time since onset of injury/illness/exacerbation, and 1-2 comorbidities: HTN, obesity  are also affecting patient's functional outcome.   REHAB POTENTIAL: Good  CLINICAL DECISION MAKING: Stable/uncomplicated  EVALUATION COMPLEXITY: Low   GOALS: Goals reviewed with patient? Yes  SHORT TERM GOALS: Target date: 02/17/23  Pt will be independent with HEP in order to improve strength and balance in order to decrease fall risk and improve function at home and work. Baseline: HEP given  Goal status: INITIAL   LONG TERM GOALS: Target date: 03/19/23  Patient will increase FOTO score to 79 to demonstrate predicted increase in functional mobility to complete ADLs Baseline:  Goal status: INITIAL  2.  Pt will be able to perform deep squat to lift 30# without pain in order to demonstrate ability to complete heavy household chores Baseline: unable to squat to deep; pain 3/10 Goal status: INITIAL  3.  Pt will demonstrate 5xSTS time of 10sec or less to demonstrate clinically significant age matched strengh norms Baseline: 14sec Goal status: INITIAL   PLAN:  PT  FREQUENCY: 1-2x/week  PT DURATION: 8 weeks  PLANNED INTERVENTIONS: Therapeutic exercises, Therapeutic activity, Neuromuscular re-education, Balance training, Gait training, Patient/Family education, Self Care, Joint mobilization, Joint manipulation, Stair training, DME instructions, Dry Needling, Electrical stimulation, Spinal manipulation, Spinal mobilization, Cryotherapy, Moist heat, Traction, Ultrasound, Fluidotherapy, Manual therapy, and Re-evaluation  PLAN FOR NEXT SESSION: plank test  Durwin Reges DPT Durwin Reges, PT 01/22/2023, 9:14 AM

## 2023-01-25 ENCOUNTER — Encounter: Payer: BC Managed Care – PPO | Admitting: Physical Therapy

## 2023-01-29 ENCOUNTER — Ambulatory Visit: Payer: BC Managed Care – PPO | Admitting: Physical Therapy

## 2023-02-01 ENCOUNTER — Encounter: Payer: BC Managed Care – PPO | Admitting: Physical Therapy

## 2023-02-05 ENCOUNTER — Ambulatory Visit: Payer: BC Managed Care – PPO | Admitting: Physical Therapy

## 2023-02-05 ENCOUNTER — Encounter: Payer: Self-pay | Admitting: Physical Therapy

## 2023-02-05 DIAGNOSIS — M25662 Stiffness of left knee, not elsewhere classified: Secondary | ICD-10-CM

## 2023-02-05 DIAGNOSIS — M25661 Stiffness of right knee, not elsewhere classified: Secondary | ICD-10-CM

## 2023-02-05 NOTE — Therapy (Signed)
OUTPATIENT PHYSICAL THERAPY LOWER EXTREMITY EVALUATION   Patient Name: Penny Fields MRN: [id number] DOB:Nov 22, 1970, 52 y.o., female Today's Date: 02/05/2023  END OF SESSION:  PT End of Session - 02/05/23 1010     Visit Number 3    Number of Visits 17    Date for PT Re-Evaluation 03/19/23    Authorization - Visit Number 3    Authorization - Number of Visits 10    Progress Note Due on Visit 10    PT Start Time 1005    PT Stop Time 1045    PT Time Calculation (min) 40 min    Activity Tolerance Patient tolerated treatment well    Behavior During Therapy WFL for tasks assessed/performed               Past Medical History:  Diagnosis Date   Goiter    History of diabetes mellitus    History of hyperlipidemia    History of hypertension    Idiopathic intracranial hypertension    Past Surgical History:  Procedure Laterality Date   ABDOMINAL HYSTERECTOMY     CYST EXCISION     on back   LUMBAR PUNCTURE     MYOMECTOMY     TONSILLECTOMY     20s   Patient Active Problem List   Diagnosis Date Noted   Hallux limitus of right foot 09/20/2018   Idiopathic intracranial hypertension 12/17/2014   Morbid obesity ([place]) 12/17/2014   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   Allergic rhinitis 07/04/2013   Essential hypertension 07/04/2013   Hyperlipidemia 07/04/2013   Lactose intolerance 07/04/2013   S/P total abdominal hysterectomy and bilateral salpingo-oophorectomy 07/04/2013   Obstructive sleep apnea 12/17/2012   Diabetes mellitus ([place]) 12/24/2009   Goiter 06/05/1997    PCP: Daryl Eastern MD  REFERRING PROVIDER: Estanislado Pandy MD  REFERRING DIAG: Bilat knee pain   THERAPY DIAG:  Stiffness of left knee, not elsewhere classified  Stiffness of right knee, not elsewhere classified  Rationale for Evaluation and Treatment: Rehabilitation  ONSET DATE: 2020  SUBJECTIVE:   SUBJECTIVE STATEMENT: Pt reports minimal knee pain this am "feels inflamed". Reports she thinks she may have broken  a toe on her R foot- endorses hitting it on a weight, currently tapped to pinky toe.   PERTINENT HISTORY: Pt is a 52 year old presenting with bilat knee pain over the past "several years". She feels like mostly she feels like her legs are not as strong as they should pain. She feels like when she squats she doesn't have enough strength to raise back up from that position. Pt reports strength is more of an issue than pain, but does have 3/10 pain with stair ambulation, and squatting. Pain is aching, isolated to knees, no n/t. Pt has started going to the gym and is having trouble with a gym routine due to knee pain. She has also started playing pickleball 1x/week and would like to be able to participate in this. Pt works full time in [employer], and reports this is mostly desk work. But does report having to move from her desk every hour or so. No falls in past 101mos. Pt denies N/V, B&B changes, unexplained weight fluctuation, saddle paresthesia, fever, night sweats, or unrelenting night pain at this time.    PAIN:  Are you having pain? Yes: NPRS scale: 0/10 Pain location: bilat knees Pain description: aching Aggravating factors: stairs, prolonged sitting, squatting Relieving factors: rest  PRECAUTIONS: None  WEIGHT BEARING RESTRICTIONS: No  FALLS:  Has patient fallen  in last 6 months? 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  PLOF: Independent  PATIENT GOALS: Get stronger  NEXT MD VISIT: none scheduled  OBJECTIVE:   DIAGNOSTIC FINDINGS: Xray 11/27/22 bilat knees  PATIENT SURVEYS:  FOTO 57 goal 81  COGNITION: Overall cognitive status: Within functional limits for tasks assessed     SENSATION: WFL  EDEMA:  None  MUSCLE LENGTH: Hamstrings: shortened bilat   POSTURE: No Significant postural limitations,  rounded shoulders, and increased thoracic kyphosis  PALPATION: No TTP  LOWER EXTREMITY ROM:  Hip mobility grossly WNL bilat Bilat knee flex approx 110d fascial restrictions; ext to 0d bilat  LOWER EXTREMITY MMT:  MMT Right eval Left eval  Hip flexion 5 5  Hip extension 4+ 4+  Hip abduction 5 5  Hip adduction    Hip internal rotation 5 5  Hip external rotation 4+ 4+  Knee flexion 5 5  Knee extension 5 5  Ankle dorsiflexion 5 5  Ankle plantarflexion    Ankle inversion    Ankle eversion     (Blank rows = not tested)   LOWER EXTREMITY SPECIAL TESTS:  Knee special tests: Apley's test: negative, McMurray's test: negative, and Thessaly test: negative  FUNCTIONAL TESTS:  5 times sit to stand: 14sec 10 meter walk test: self selected: 1.35m/s    GAIT: Distance walked: 53M Assistive device utilized:  none  Level of assistance: Complete Independence Comments: normal gait; normal speed   TODAY'S TREATMENT:                                                                                                                              DATE: 01/15/23 Nustep seat seat 11 UE 16 L3 83mins for gentle rotation and strengthening KB swings 20# 2x 12 with good carry over of cuing for initial demo  Alt lateral lunge 2x 12 with good carry over of demo [place] split squat 2x 10 each LE with good carry over of demo; difficulty L>R Leg press 2x 10/12 135#     PATIENT EDUCATION:  Education details: Patient was educated on diagnosis, anatomy and pathology involved, prognosis, role of PT, and was given an HEP, demonstrating exercise with proper form following verbal and tactile cues, and was given a paper hand out to continue exercise at home. Pt was educated on and agreed to plan of care.  Person educated: Patient Education method: Explanation, Demonstration, Verbal cues, and Handouts Education comprehension: verbalized understanding, returned demonstration, and verbal cues required  HOME  EXERCISE PROGRAM: [id number]  ASSESSMENT:  CLINICAL IMPRESSION: PT continued therex progression for increased BLE and core strengthening with success. PT modified therex to prevent toe ext due to pain. Patient is able to comply with all cuing for proper technique of therex with good motivation and no increased pain throughout session.  PT will continue progression as able.     OBJECTIVE IMPAIRMENTS: decreased activity tolerance, decreased ROM, decreased strength, increased fascial restrictions, impaired flexibility, improper body mechanics, postural dysfunction, obesity, and pain.   ACTIVITY LIMITATIONS: carrying, lifting, sitting, standing, squatting, stairs, transfers, and locomotion level  PARTICIPATION LIMITATIONS: cleaning, driving, shopping, community activity, and occupation  PERSONAL FACTORS: Fitness, Past/current experiences, Time since onset of injury/illness/exacerbation, and 1-2 comorbidities: HTN, obesity  are also affecting patient's functional outcome.   REHAB POTENTIAL: Good  CLINICAL DECISION MAKING: Stable/uncomplicated  EVALUATION COMPLEXITY: Low   GOALS: Goals reviewed with patient? Yes  SHORT TERM GOALS: Target date: 02/17/23  Pt will be independent with HEP in order to improve strength and balance in order to decrease fall risk and improve function at home and work. Baseline: HEP given  Goal status: INITIAL   LONG TERM GOALS: Target date: 03/19/23  Patient will increase FOTO score to 79 to demonstrate predicted increase in functional mobility to complete ADLs Baseline:  Goal status: INITIAL  2.  Pt will be able to perform deep squat to lift 30# without pain in order to demonstrate ability to complete heavy household chores Baseline: unable to squat to deep; pain 3/10 Goal status: INITIAL  3.  Pt will demonstrate 5xSTS time of 10sec or less to demonstrate clinically significant age matched strengh norms Baseline: 14sec Goal status:  INITIAL   PLAN:  PT FREQUENCY: 1-2x/week  PT DURATION: 8 weeks  PLANNED INTERVENTIONS: Therapeutic exercises, Therapeutic activity, Neuromuscular re-education, Balance training, Gait training, Patient/Family education, Self Care, Joint mobilization, Joint manipulation, Stair training, DME instructions, Dry Needling, Electrical stimulation, Spinal manipulation, Spinal mobilization, Cryotherapy, Moist heat, Traction, Ultrasound, Fluidotherapy, Manual therapy, and Re-evaluation  PLAN FOR NEXT SESSION: plank test  Durwin Reges DPT Durwin Reges, PT 02/05/2023, 10:47 AM

## 2023-02-08 ENCOUNTER — Encounter: Payer: BC Managed Care – PPO | Admitting: Physical Therapy

## 2023-02-12 ENCOUNTER — Encounter: Payer: Self-pay | Admitting: Physical Therapy

## 2023-02-12 ENCOUNTER — Ambulatory Visit: Payer: BC Managed Care – PPO | Attending: Rheumatology | Admitting: Physical Therapy

## 2023-02-12 DIAGNOSIS — M25662 Stiffness of left knee, not elsewhere classified: Secondary | ICD-10-CM | POA: Diagnosis present

## 2023-02-12 NOTE — Therapy (Signed)
OUTPATIENT PHYSICAL THERAPY LOWER EXTREMITY TREATMENT/DISCHARGE SUMMARY  Reporting Period 01/15/23 - 02/12/23   Patient Name: Penny BrunsMary Kevorkian MRN: [id number] DOB:09/05/1971, 52 y.o., female Today's Date: 02/12/2023  END OF SESSION:  PT End of Session - 02/12/23 1003     Visit Number 4    Number of Visits 17    Date for PT Re-Evaluation 03/19/23    Authorization - Visit Number 4    Authorization - Number of Visits 10    Progress Note Due on Visit 10    PT Start Time 1000    PT Stop Time 1027    PT Time Calculation (min) 27 min    Activity Tolerance Patient tolerated treatment well    Behavior During Therapy WFL for tasks assessed/performed                Past Medical History:  Diagnosis Date   Goiter    History of diabetes mellitus    History of hyperlipidemia    History of hypertension    Idiopathic intracranial hypertension    Past Surgical History:  Procedure Laterality Date   ABDOMINAL HYSTERECTOMY     CYST EXCISION     on back   LUMBAR PUNCTURE     MYOMECTOMY     TONSILLECTOMY     20s   Patient Active Problem List   Diagnosis Date Noted   Hallux limitus of right foot 09/20/2018   Idiopathic intracranial hypertension 12/17/2014   Morbid obesity 12/17/2014   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   Allergic rhinitis 07/04/2013   Essential hypertension 07/04/2013   Hyperlipidemia 07/04/2013   Lactose intolerance 07/04/2013   S/P total abdominal hysterectomy and bilateral salpingo-oophorectomy 07/04/2013   Obstructive sleep apnea 12/17/2012   Diabetes mellitus 12/24/2009   Goiter 06/05/1997    PCP: Lennice SitesHelton MD  REFERRING PROVIDER: Corliss Skainseveshwar MD  REFERRING DIAG: Bilat knee pain   THERAPY DIAG:  Stiffness of left knee, not elsewhere classified  Rationale for Evaluation and Treatment: Rehabilitation  ONSET DATE: 2020  SUBJECTIVE:   SUBJECTIVE STATEMENT: Pt reports her knee are sore from heading up so many events over the weekend and playing pickleball.  Reports pain is 3/10  PERTINENT HISTORY: Pt is a 52 year old presenting with bilat knee pain over the past "several years". She feels like mostly she feels like her legs are not as strong as they should pain. She feels like when she squats she doesn't have enough strength to raise back up from that position. Pt reports strength is more of an issue than pain, but does have 3/10 pain with stair ambulation, and squatting. Pain is aching, isolated to knees, no n/t. Pt has started going to the gym and is having trouble with a gym routine due to knee pain. She has also started playing pickleball 1x/week and would like to be able to participate in this. Pt works full time in [employer], and reports this is mostly desk work. But does report having to move from her desk every hour or so. No falls in past 6mos. Pt denies N/V, B&B changes, unexplained weight fluctuation, saddle paresthesia, fever, night sweats, or unrelenting night pain at this time.    PAIN:  Are you having pain? Yes: NPRS scale: 0/10 Pain location: bilat knees Pain description: aching Aggravating factors: stairs, prolonged sitting, squatting Relieving factors: rest  PRECAUTIONS: None  WEIGHT BEARING RESTRICTIONS: No  FALLS:  Has patient fallen in last 6 months? No  LIVING ENVIRONMENT: Lives with: lives with their family  Lives in: House/apartment Stairs: Yes: External: 8 steps; can reach both Has following equipment at home: None  OCCUPATION: Full time church admin  PLOF: Independent  PATIENT GOALS: Get stronger  NEXT MD VISIT: none scheduled  OBJECTIVE:   DIAGNOSTIC FINDINGS: Xray 11/27/22 bilat knees  PATIENT SURVEYS:  FOTO 57 goal 81  COGNITION: Overall cognitive status: Within functional limits for tasks assessed     SENSATION: WFL  EDEMA:  None  MUSCLE LENGTH: Hamstrings: shortened bilat   POSTURE: No Significant postural limitations, rounded shoulders, and increased thoracic  kyphosis  PALPATION: No TTP  LOWER EXTREMITY ROM:  Hip mobility grossly WNL bilat Bilat knee flex approx 110d fascial restrictions; ext to 0d bilat  LOWER EXTREMITY MMT:  MMT Right eval Left eval  Hip flexion 5 5  Hip extension 4+ 4+  Hip abduction 5 5  Hip adduction    Hip internal rotation 5 5  Hip external rotation 4+ 4+  Knee flexion 5 5  Knee extension 5 5  Ankle dorsiflexion 5 5  Ankle plantarflexion    Ankle inversion    Ankle eversion     (Blank rows = not tested)   LOWER EXTREMITY SPECIAL TESTS:  Knee special tests: Apley's test: negative, McMurray's test: negative, and Thessaly test: negative  FUNCTIONAL TESTS:  5 times sit to stand: 14sec 10 meter walk test: self selected: 1.12m/s    GAIT: Distance walked: 25M Assistive device utilized:  none  Level of assistance: Complete Independence Comments: normal gait; normal speed   TODAY'S TREATMENT:                                                                                                                              DATE: 01/15/23 Nustep seat seat 11 UE 16 L3 for gentle rotation and strengthening 5xSTS x2 trials  PT reviewed the following HEP with patient with patient able to verbalize understanding of the following with min cuing for correction needed. PT educated patient on parameters of therex (how/when to inc/decrease intensity, frequency, rep/set range, stretch hold time, and purpose of therex) with verbalized understanding.   Access Code: [email or web address] - Sumo Squat with Dumbbell  - 1-2 x weekly - 3 sets - 8-12 reps - Kettlebell Deadlift  - 1-2 x weekly - 3 sets - 8-12 reps - Single Leg Bridge  - 1-2 x weekly - 3 sets - 6-12 reps - Standard Lunge  - 1-2 x weekly - 3 sets - 12-20 reps - Standing Cable Hip Abduction  - 1-2 x weekly - 2-3 sets - 8-12 reps - Full Leg Press  - 1-2 x weekly - 2-3 sets - 8-12 reps - Single Leg Lunge with Foot on Bench  - 1-2 x weekly - 2-3 sets - 8-12 reps - Lateral  Lunge  - 1-2 x weekly - 2-3 sets - 8-12 reps    PATIENT EDUCATION:  Education details: Patient was educated on diagnosis, anatomy and pathology involved,  prognosis, role of PT, and was given an HEP, demonstrating exercise with proper form following verbal and tactile cues, and was given a paper hand out to continue exercise at home. Pt was educated on and agreed to plan of care.  Person educated: Patient Education method: Explanation, Demonstration, Verbal cues, and Handouts Education comprehension: verbalized understanding, returned demonstration, and verbal cues required  HOME EXERCISE PROGRAM: [id number]  ASSESSMENT:  CLINICAL IMPRESSION: PT reassessed goals this session where patient has met all goals to safely d/c formal PT. Patient is able to demonstrate and verbalize understanding of all HEP recommendations with minimal corrections needed. Pt given clinic contact info should further questions or concerns arise. Pt to d/c PT.      OBJECTIVE IMPAIRMENTS: decreased activity tolerance, decreased ROM, decreased strength, increased fascial restrictions, impaired flexibility, improper body mechanics, postural dysfunction, obesity, and pain.   ACTIVITY LIMITATIONS: carrying, lifting, sitting, standing, squatting, stairs, transfers, and locomotion level  PARTICIPATION LIMITATIONS: cleaning, driving, shopping, community activity, and occupation  PERSONAL FACTORS: Fitness, Past/current experiences, Time since onset of injury/illness/exacerbation, and 1-2 comorbidities: HTN, obesity  are also affecting patient's functional outcome.   REHAB POTENTIAL: Good  CLINICAL DECISION MAKING: Stable/uncomplicated  EVALUATION COMPLEXITY: Low   GOALS: Goals reviewed with patient? Yes  SHORT TERM GOALS: Target date: 02/17/23  Pt will be independent with HEP in order to improve strength and balance in order to decrease fall risk and improve function at home and work. Baseline: HEP given  Goal  status: INITIAL   LONG TERM GOALS: Target date: 03/19/23  Patient will increase FOTO score to 79 to demonstrate predicted increase in functional mobility to complete ADLs Baseline: 71; 02/12/23 86 Goal status: MET  2.  Pt will be able to perform deep squat to lift 30# without pain in order to demonstrate ability to complete heavy household chores Baseline: unable to squat to deep; pain 3/10; 02/12/23 Deep squat with 3/10 pain  Goal status: MET  3.  Pt will demonstrate 5xSTS time of 10sec or less to demonstrate clinically significant age matched strengh norms Baseline: 14sec; 02/12/23 9sec Goal status: MET   PLAN:  PT FREQUENCY: 1-2x/week  PT DURATION: 8 weeks  PLANNED INTERVENTIONS: Therapeutic exercises, Therapeutic activity, Neuromuscular re-education, Balance training, Gait training, Patient/Family education, Self Care, Joint mobilization, Joint manipulation, Stair training, DME instructions, Dry Needling, Electrical stimulation, Spinal manipulation, Spinal mobilization, Cryotherapy, Moist heat, Traction, Ultrasound, Fluidotherapy, Manual therapy, and Re-evaluation  PLAN FOR NEXT SESSION:   Hilda Liashelsea Rockford Leinen DPT Hilda Liashelsea Geralene Afshar, PT 02/12/2023, 10:31 AM

## 2023-02-15 ENCOUNTER — Encounter: Payer: BC Managed Care – PPO | Admitting: Physical Therapy

## 2023-02-18 ENCOUNTER — Ambulatory Visit: Payer: BC Managed Care – PPO

## 2023-02-19 ENCOUNTER — Encounter: Payer: BC Managed Care – PPO | Admitting: Physical Therapy

## 2023-02-22 ENCOUNTER — Encounter: Payer: BC Managed Care – PPO | Admitting: Physical Therapy

## 2023-02-26 ENCOUNTER — Ambulatory Visit: Payer: BC Managed Care – PPO

## 2023-02-26 NOTE — Therapy (Deleted)
OUTPATIENT PHYSICAL THERAPY LOWER EXTREMITY TREATMENT/DISCHARGE SUMMARY  Reporting Period 01/15/23 - 02/12/23   Patient Name: Penny Fields MRN: [id number] DOB:November 28, 1970, 52 y.o., female Today's Date: 02/26/2023  END OF SESSION:       Past Medical History:  Diagnosis Date   Goiter    History of diabetes mellitus    History of hyperlipidemia    History of hypertension    Idiopathic intracranial hypertension    Past Surgical History:  Procedure Laterality Date   ABDOMINAL HYSTERECTOMY     CYST EXCISION     on back   LUMBAR PUNCTURE     MYOMECTOMY     TONSILLECTOMY     20s   Patient Active Problem List   Diagnosis Date Noted   Hallux limitus of right foot 09/20/2018   Idiopathic intracranial hypertension 12/17/2014   Morbid obesity 12/17/2014   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   Allergic rhinitis 07/04/2013   Essential hypertension 07/04/2013   Hyperlipidemia 07/04/2013   Lactose intolerance 07/04/2013   S/P total abdominal hysterectomy and bilateral salpingo-oophorectomy 07/04/2013   Obstructive sleep apnea 12/17/2012   Diabetes mellitus 12/24/2009   Goiter 06/05/1997    PCP: Lennice Sites MD  REFERRING PROVIDER: Corliss Skains MD  REFERRING DIAG: Bilat knee pain   THERAPY DIAG:  No diagnosis found.  Rationale for Evaluation and Treatment: Rehabilitation  ONSET DATE: 2020  SUBJECTIVE:   SUBJECTIVE STATEMENT: Pt reports her knee are sore from heading up so many events over the weekend and playing pickleball. Reports pain is 3/10  PERTINENT HISTORY: Pt is a 52 year old presenting with bilat knee pain over the past "several years". She feels like mostly she feels like her legs are not as strong as they should pain. She feels like when she squats she doesn't have enough strength to raise back up from that position. Pt reports strength is more of an issue than pain, but does have 3/10 pain with stair ambulation, and squatting. Pain is aching, isolated to knees, no n/t. Pt  has started going to the gym and is having trouble with a gym routine due to knee pain. She has also started playing pickleball 1x/week and would like to be able to participate in this. Pt works full time in [employer], and reports this is mostly desk work. But does report having to move from her desk every hour or so. No falls in past 6mos. Pt denies N/V, B&B changes, unexplained weight fluctuation, saddle paresthesia, fever, night sweats, or unrelenting night pain at this time.    PAIN:  Are you having pain? Yes: NPRS scale: 0/10 Pain location: bilat knees Pain description: aching Aggravating factors: stairs, prolonged sitting, squatting Relieving factors: rest  PRECAUTIONS: None  WEIGHT BEARING RESTRICTIONS: No  FALLS:  Has patient fallen in last 6 months? 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  PLOF: Independent  PATIENT GOALS: Get stronger  NEXT MD VISIT: none scheduled  OBJECTIVE:   DIAGNOSTIC FINDINGS: Xray 11/27/22 bilat knees  PATIENT SURVEYS:  FOTO 57 goal 81  COGNITION: Overall cognitive status: Within functional limits for tasks assessed     SENSATION: WFL  EDEMA:  None  MUSCLE LENGTH: Hamstrings: shortened bilat   POSTURE: No Significant postural limitations, rounded shoulders, and increased thoracic kyphosis  PALPATION: No TTP  LOWER EXTREMITY ROM:  Hip mobility grossly WNL bilat Bilat knee  flex approx 110d fascial restrictions; ext to 0d bilat  LOWER EXTREMITY MMT:  MMT Right eval Left eval  Hip flexion 5 5  Hip extension 4+ 4+  Hip abduction 5 5  Hip adduction    Hip internal rotation 5 5  Hip external rotation 4+ 4+  Knee flexion 5 5  Knee extension 5 5  Ankle dorsiflexion 5 5  Ankle plantarflexion    Ankle inversion    Ankle eversion     (Blank rows = not  tested)   LOWER EXTREMITY SPECIAL TESTS:  Knee special tests: Apley's test: negative, McMurray's test: negative, and Thessaly test: negative  FUNCTIONAL TESTS:  5 times sit to stand: 14sec 10 meter walk test: self selected: 1.21m/s    GAIT: Distance walked: 61M Assistive device utilized:  none  Level of assistance: Complete Independence Comments: normal gait; normal speed   TODAY'S TREATMENT:                                                                                                                              DATE: 01/15/23 Nustep seat seat 11 UE 16 L3 for gentle rotation and strengthening 5xSTS x2 trials  PT reviewed the following HEP with patient with patient able to verbalize understanding of the following with min cuing for correction needed. PT educated patient on parameters of therex (how/when to inc/decrease intensity, frequency, rep/set range, stretch hold time, and purpose of therex) with verbalized understanding.   Access Code: [email or web address] - Sumo Squat with Dumbbell  - 1-2 x weekly - 3 sets - 8-12 reps - Kettlebell Deadlift  - 1-2 x weekly - 3 sets - 8-12 reps - Single Leg Bridge  - 1-2 x weekly - 3 sets - 6-12 reps - Standard Lunge  - 1-2 x weekly - 3 sets - 12-20 reps - Standing Cable Hip Abduction  - 1-2 x weekly - 2-3 sets - 8-12 reps - Full Leg Press  - 1-2 x weekly - 2-3 sets - 8-12 reps - Single Leg Lunge with Foot on Bench  - 1-2 x weekly - 2-3 sets - 8-12 reps - Lateral Lunge  - 1-2 x weekly - 2-3 sets - 8-12 reps    PATIENT EDUCATION:  Education details: Patient was educated on diagnosis, anatomy and pathology involved, prognosis, role of PT, and was given an HEP, demonstrating exercise with proper form following verbal and tactile cues, and was given a paper hand out to continue exercise at home. Pt was educated on and agreed to plan of care.  Person educated: Patient Education method: Explanation, Demonstration, Verbal cues, and  Handouts Education comprehension: verbalized understanding, returned demonstration, and verbal cues required  HOME EXERCISE PROGRAM: [email or web address]  ASSESSMENT:  CLINICAL IMPRESSION: PT reassessed goals this session where patient has met all goals to safely d/c formal PT. Patient is able to demonstrate and verbalize understanding of all HEP recommendations with minimal corrections needed. Pt given clinic contact info should  further questions or concerns arise. Pt to d/c PT.      OBJECTIVE IMPAIRMENTS: decreased activity tolerance, decreased ROM, decreased strength, increased fascial restrictions, impaired flexibility, improper body mechanics, postural dysfunction, obesity, and pain.   ACTIVITY LIMITATIONS: carrying, lifting, sitting, standing, squatting, stairs, transfers, and locomotion level  PARTICIPATION LIMITATIONS: cleaning, driving, shopping, community activity, and occupation  PERSONAL FACTORS: Fitness, Past/current experiences, Time since onset of injury/illness/exacerbation, and 1-2 comorbidities: HTN, obesity  are also affecting patient's functional outcome.   REHAB POTENTIAL: Good  CLINICAL DECISION MAKING: Stable/uncomplicated  EVALUATION COMPLEXITY: Low   GOALS: Goals reviewed with patient? Yes  SHORT TERM GOALS: Target date: 02/17/23  Pt will be independent with HEP in order to improve strength and balance in order to decrease fall risk and improve function at home and work. Baseline: HEP given  Goal status: INITIAL   LONG TERM GOALS: Target date: 03/19/23  Patient will increase FOTO score to 79 to demonstrate predicted increase in functional mobility to complete ADLs Baseline: 71; 02/12/23 86 Goal status: MET  2.  Pt will be able to perform deep squat to lift 30# without pain in order to demonstrate ability to complete heavy household chores Baseline: unable to squat to deep; pain 3/10; 02/12/23 Deep squat with 3/10 pain  Goal status: MET  3.  Pt will demonstrate  5xSTS time of 10sec or less to demonstrate clinically significant age matched strengh norms Baseline: 14sec; 02/12/23 9sec Goal status: MET   PLAN:  PT FREQUENCY: 1-2x/week  PT DURATION: 8 weeks  PLANNED INTERVENTIONS: Therapeutic exercises, Therapeutic activity, Neuromuscular re-education, Balance training, Gait training, Patient/Family education, Self Care, Joint mobilization, Joint manipulation, Stair training, DME instructions, Dry Needling, Electrical stimulation, Spinal manipulation, Spinal mobilization, Cryotherapy, Moist heat, Traction, Ultrasound, Fluidotherapy, Manual therapy, and Re-evaluation  PLAN FOR NEXT SESSION:   Hilda Lias DPT Phineas Real, PT 02/26/2023, 7:48 AM

## 2023-03-01 ENCOUNTER — Encounter: Payer: BC Managed Care – PPO | Admitting: Physical Therapy

## 2023-03-04 ENCOUNTER — Ambulatory Visit: Payer: BC Managed Care – PPO

## 2023-03-08 ENCOUNTER — Encounter: Payer: BC Managed Care – PPO | Admitting: Physical Therapy

## 2024-02-10 NOTE — Progress Notes (Signed)
 Office Visit Note  Patient: Penny Fields             Date of Birth: November 14, 1970           MRN: [id number]             PCP: Jerene Monks, MD Referring: Jerene Monks, MD Visit Date: 02/22/2024 Occupation: @GUAROCC @  Subjective:  Intermittent rash on face   History of Present Illness: Penny Fields is a 53 y.o. female with history of osteoarthritis.  She returns today after her last visit in January 2024.  She states she has been experiencing rash around her mouth almost on a monthly basis.  She was trying to exclude gluten.  Although she had gluten last week.  She continues to have some intermittent discomfort in her knee joints.  She does not have much discomfort in her feet.  She has no discomfort in her hands today.  She denies any history of oral ulcers, nasal ulcers, malar rash, Raynaud's phenomenon, lymphadenopathy or inflammatory arthritis.  She reports dry eyes.  There is no family history of autoimmune disease.  She denies any diarrhea or abdominal discomfort.  She has had intermittent reflux symptoms.  Patient states Dr. Clearence Curet has diagnosed her with gout in the past and gave her allopurinol but she discontinued the medication as she did not notice any improvement.  She plays pickle ball.  She has been having pain and discomfort in her right shoulder joint for the last several months.  She has been having difficulty raising her right arm.  As she states she has a frozen shoulder currently.  She is having difficulty getting dressed.    Activities of Daily Living:  Patient reports morning stiffness for a few steps.  Patient Denies nocturnal pain.  Difficulty dressing/grooming: Denies Difficulty climbing stairs: Denies Difficulty getting out of chair: Denies Difficulty using hands for taps, buttons, cutlery, and/or writing: Denies  Review of Systems  Constitutional:  Negative for fatigue.  HENT:  Negative for mouth sores, mouth dryness and nose dryness.   Eyes:  Positive for  dryness.  Respiratory:  Negative for shortness of breath.   Cardiovascular:  Negative for chest pain and palpitations.  Gastrointestinal:  Negative for blood in stool, constipation and diarrhea.  Endocrine: Negative for increased urination.  Genitourinary:  Negative for involuntary urination.  Musculoskeletal:  Positive for joint pain, joint pain, muscle weakness and morning stiffness. Negative for gait problem, joint swelling, myalgias, muscle tenderness and myalgias.  Skin:  Negative for color change, rash, hair loss and sensitivity to sunlight.  Allergic/Immunologic: Negative for susceptible to infections.  Neurological:  Negative for dizziness and headaches.  Hematological:  Negative for swollen glands.  Psychiatric/Behavioral:  Negative for depressed mood and sleep disturbance. The patient is not nervous/anxious.     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   Allergic rhinitis 07/04/2013   Essential hypertension 07/04/2013   Hyperlipidemia 07/04/2013   Lactose intolerance 07/04/2013   S/P total abdominal hysterectomy and bilateral salpingo-oophorectomy 07/04/2013   Obstructive sleep apnea 12/17/2012   Diabetes mellitus (HCC) 12/24/2009   Goiter 06/05/1997    Past Medical History:  Diagnosis Date   Goiter    History of diabetes mellitus    History of hyperlipidemia    History of hypertension    Idiopathic  intracranial hypertension     Family History  Problem Relation Age of Onset   Diabetes Mother    Hypertension Mother    Kidney disease Mother    Hypertension Father    Diabetes Father    Heart failure Father    Hypertension Sister    Diabetes Sister    Past Surgical History:  Procedure Laterality Date   ABDOMINAL HYSTERECTOMY     CYST EXCISION     on back   LUMBAR PUNCTURE      MYOMECTOMY     TONSILLECTOMY     27s   Social History   Social History Narrative   Not on file   Immunization History  Administered Date(s) Administered   Influenza,inj,Quad PF,6+ Mos 08/07/2019   Influenza,inj,quad, With Preservative 08/17/2018   Influenza-Unspecified 08/23/2014, 08/13/2016, 08/23/2017   PFIZER(Purple Top)SARS-COV-2 Vaccination 01/23/2020, 02/20/2020   Pneumococcal Polysaccharide-23 08/10/2013   Typhoid Inactivated 10/04/2006     Objective: Vital Signs: BP 131/78 (BP Location: Left Arm, Patient Position: Sitting, Cuff Size: Large)   Pulse 67   Resp 16   Ht 5' 7.5" (1.715 m)   Wt 291 lb 12.8 oz (132.4 kg)   BMI 45.03 kg/m    Physical Exam Vitals and nursing note reviewed.  Constitutional:      Appearance: She is well-developed.  HENT:     Head: Normocephalic and atraumatic.  Eyes:     Conjunctiva/sclera: Conjunctivae normal.  Cardiovascular:     Rate and Rhythm: Normal rate and regular rhythm.     Heart sounds: Normal heart sounds.  Pulmonary:     Effort: Pulmonary effort is normal.     Breath sounds: Normal breath sounds.  Abdominal:     General: Bowel sounds are normal.     Palpations: Abdomen is soft.  Musculoskeletal:     Cervical back: Normal range of motion.  Lymphadenopathy:     Cervical: No cervical adenopathy.  Skin:    General: Skin is warm and dry.     Capillary Refill: Capillary refill takes less than 2 seconds.  Neurological:     Mental Status: She is alert and oriented to person, place, and time.  Psychiatric:        Behavior: Behavior normal.      Musculoskeletal Exam: Cervical spine was in good range of motion.  There was no tenderness over thoracic or lumbar spine.  She had good mobility in the lumbar spine.  Right shoulder joint forward flexion and abduction was limited to 140 degrees.  She had limited internal rotation.  Left shoulder joint was in full range of motion.  Elbow joints, wrist joints, MCPs PIPs and DIPs in  good range of motion with no synovitis.  Hip joints and knee joints in good range of motion without any warmth swelling or effusion.  There was no tenderness over ankles or MTPs.  CDAI Exam: CDAI Score: -- Patient Global: --; Provider Global: -- Swollen: --; Tender: -- Joint Exam 02/22/2024   No joint exam has been documented for this visit   There is currently no information documented on the homunculus. Go to the Rheumatology activity and complete the homunculus joint exam.  Investigation: No additional findings.  Imaging: XR Shoulder Right Result Date: 02/22/2024 No significant glenohumeral joint space narrowing was noted.  Acromioclavicular joint space narrowing and acromial spurring was noted. Impression: These findings are suggestive of osteoarthritis of the shoulder.   Recent Labs: Lab Results  Component Value Date   WBC 5.9 12/11/2012  HGB 12.4 12/11/2012   PLT 234 12/11/2012   NA 137 12/23/2020   K 5.1 12/23/2020   CL 101 12/23/2020   CO2 21 12/23/2020   GLUCOSE 100 (H) 12/23/2020   BUN 18 12/23/2020   CREATININE 0.97 12/23/2020   BILITOT <0.2 12/23/2020   ALKPHOS 79 12/23/2020   AST 20 12/23/2020   ALT 19 12/23/2020   PROT 7.3 12/23/2020   ALBUMIN 4.2 12/23/2020   CALCIUM 9.4 12/23/2020   GFRAA 78 12/23/2020    Speciality Comments: No specialty comments available.  Procedures:  Large Joint Inj: R glenohumeral on 02/22/2024 4:39 PM Indications: pain Details: 27 G 1.5 in needle, posterior approach  Arthrogram: No  Medications: 1.5 mL lidocaine 1 %; 40 mg triamcinolone acetonide 40 MG/ML Aspirate: 0 mL Outcome: tolerated well, no immediate complications Procedure, treatment alternatives, risks and benefits explained, specific risks discussed. Consent was given by the patient. Immediately prior to procedure a time out was called to verify the correct patient, procedure, equipment, support staff and site/side marked as required. Patient was prepped and  draped in the usual sterile fashion.     Allergies: Metronidazole, Oxycodone-acetaminophen, and Penicillins   Assessment / Plan:     Visit Diagnoses: Chronic right shoulder pain -patient has been experiencing pain and discomfort in her right shoulder joint for the last 6 months.  She states she plays pickle ball and she is having difficulty with her sore.  She is also having difficulty raising her arm and getting dressed.  She had limited forward flexion, internal rotation and abduction.  Different treatment options and their side effects were discussed.  Patient requested a cortisone injection.  Side effects including elevated blood pressure, elevated blood glucose level, dermal atrophy, discoloration, tendon and nerve injuries were discussed.  Right glenohumeral joint was injected as described above.  Patient tolerated the procedure well.  Postprocedure instructions were given.  Plan: XR Shoulder Right.  X-rays showed acromioclavicular narrowing and acromial spurring.  A handout on shoulder joint exercises was given.  Patient declined physical therapy.  Chondromalacia of both patellae-previous x-rays showed chondromalacia patella.  She does have some discomfort in her knee joints.  No synovitis was noted on the examination.  Hallux limitus of right foot - Followed by Dr. Lara Plants.  Primary osteoarthritis of both feet -patient was diagnosed with gout in the past.  She was treated with allopurinol.  Patient states she discontinued allopurinol as she did not noticed any improvement.  Plan: Uric acid  Primary osteoarthritis of both hands-she did has any discomfort in her hands.  No PIP or DIP thickening was noted.  Rash -she has been getting recurrent rash around her lips.  She is concerned about celiac disease and lupus.  She denies any history of oral ulcers, nasal ulcers, sicca symptoms, malar rash, photosensitivity or lymphadenopathy.  I will obtain following labs.  She also requested referral to an  allergist for food allergies.  Plan: ANA, Gliadin antibodies, serum, Tissue transglutaminase, IgA  Myalgia -she had no muscular weakness or tenderness on the examination.  Plan: CK  Essential hypertension-blood pressure was normal at 131/78.  She is on lisinopril.  History of diabetes mellitus-patient stated that her diabetes is well-controlled.  Advised her to monitor blood glucose level.  She is on Ozempic.  History of hyperlipidemia-she takes Lipitor.  Idiopathic intracranial hypertension-patient denies any recent headaches or concerns.  Goiter  Obstructive sleep apnea  S/P total abdominal hysterectomy and bilateral salpingo-oophorectomy  Orders: Orders Placed This Encounter  Procedures   Large Joint Inj   XR Shoulder Right   ANA   Gliadin antibodies, serum   Tissue transglutaminase, IgA   CK   Uric acid   Ambulatory referral to Allergy   No orders of the defined types were placed in this encounter.    Follow-Up Instructions: Return in about 3 months (around 05/23/2024) for Osteoarthritis.   Nicholas Bari, MD  Note - This record has been created using Animal nutritionist.  Chart creation errors have been sought, but may not always  have been located. Such creation errors do not reflect on  the standard of medical care.

## 2024-02-22 ENCOUNTER — Ambulatory Visit: Payer: Self-pay | Attending: Rheumatology | Admitting: Rheumatology

## 2024-02-22 ENCOUNTER — Ambulatory Visit (INDEPENDENT_AMBULATORY_CARE_PROVIDER_SITE_OTHER)

## 2024-02-22 ENCOUNTER — Encounter: Payer: Self-pay | Admitting: Rheumatology

## 2024-02-22 VITALS — BP 131/78 | HR 67 | Resp 16 | Ht 67.5 in | Wt 291.8 lb

## 2024-02-22 DIAGNOSIS — Z9079 Acquired absence of other genital organ(s): Secondary | ICD-10-CM

## 2024-02-22 DIAGNOSIS — M25511 Pain in right shoulder: Secondary | ICD-10-CM

## 2024-02-22 DIAGNOSIS — Z90710 Acquired absence of both cervix and uterus: Secondary | ICD-10-CM

## 2024-02-22 DIAGNOSIS — G8929 Other chronic pain: Secondary | ICD-10-CM

## 2024-02-22 DIAGNOSIS — M19071 Primary osteoarthritis, right ankle and foot: Secondary | ICD-10-CM | POA: Diagnosis not present

## 2024-02-22 DIAGNOSIS — G4733 Obstructive sleep apnea (adult) (pediatric): Secondary | ICD-10-CM

## 2024-02-22 DIAGNOSIS — M791 Myalgia, unspecified site: Secondary | ICD-10-CM

## 2024-02-22 DIAGNOSIS — Z8639 Personal history of other endocrine, nutritional and metabolic disease: Secondary | ICD-10-CM

## 2024-02-22 DIAGNOSIS — M19042 Primary osteoarthritis, left hand: Secondary | ICD-10-CM

## 2024-02-22 DIAGNOSIS — E049 Nontoxic goiter, unspecified: Secondary | ICD-10-CM

## 2024-02-22 DIAGNOSIS — G932 Benign intracranial hypertension: Secondary | ICD-10-CM

## 2024-02-22 DIAGNOSIS — M2241 Chondromalacia patellae, right knee: Secondary | ICD-10-CM | POA: Diagnosis not present

## 2024-02-22 DIAGNOSIS — M205X1 Other deformities of toe(s) (acquired), right foot: Secondary | ICD-10-CM | POA: Diagnosis not present

## 2024-02-22 DIAGNOSIS — M19072 Primary osteoarthritis, left ankle and foot: Secondary | ICD-10-CM

## 2024-02-22 DIAGNOSIS — Z90722 Acquired absence of ovaries, bilateral: Secondary | ICD-10-CM

## 2024-02-22 DIAGNOSIS — I10 Essential (primary) hypertension: Secondary | ICD-10-CM

## 2024-02-22 DIAGNOSIS — M19041 Primary osteoarthritis, right hand: Secondary | ICD-10-CM

## 2024-02-22 DIAGNOSIS — M2242 Chondromalacia patellae, left knee: Secondary | ICD-10-CM

## 2024-02-22 DIAGNOSIS — R21 Rash and other nonspecific skin eruption: Secondary | ICD-10-CM

## 2024-02-22 MED ORDER — TRIAMCINOLONE ACETONIDE 40 MG/ML IJ SUSP
40.0000 mg | INTRAMUSCULAR | Status: AC | PRN
  Administered 2024-02-22: 40 mg via INTRA_ARTICULAR

## 2024-02-22 MED ORDER — LIDOCAINE HCL 1 % IJ SOLN
1.5000 mL | INTRAMUSCULAR | Status: AC | PRN
  Administered 2024-02-22: 1.5 mL

## 2024-02-22 NOTE — Patient Instructions (Signed)

## 2024-02-24 LAB — CK: Total CK: 141 U/L (ref 21–240)

## 2024-02-24 LAB — URIC ACID: Uric Acid, Serum: 7.1 mg/dL — ABNORMAL HIGH (ref 2.5–7.0)

## 2024-02-24 LAB — TISSUE TRANSGLUTAMINASE, IGA: (tTG) Ab, IgA: <1 U/mL

## 2024-02-24 LAB — GLIADIN ANTIBODIES, SERUM
Gliadin IgA: 6.2 U/mL
Gliadin IgG: <1 U/mL

## 2024-02-24 LAB — ANA: Anti Nuclear Antibody (ANA): NEGATIVE

## 2024-02-29 NOTE — Progress Notes (Signed)
 Uric acid 7.1 which is mildly elevated.  Patient should practice low purine diet which should include avoidance of red meat, shellfish, alcohol intake.  ANA negative, antibodies for celiac disease negative, CK normal.  I will discuss results at the follow-up visit.

## 2024-03-16 ENCOUNTER — Ambulatory Visit
Admission: RE | Admit: 2024-03-16 | Discharge: 2024-03-16 | Disposition: A | Discharge status: Home / Self Care | Source: Ambulatory Visit | Attending: Emergency Medicine | Admitting: Emergency Medicine

## 2024-03-16 VITALS — BP 119/84 | HR 86 | Temp 97.7°F | Resp 18

## 2024-03-16 DIAGNOSIS — M62838 Other muscle spasm: Secondary | ICD-10-CM | POA: Diagnosis not present

## 2024-03-16 MED ORDER — CYCLOBENZAPRINE HCL 10 MG PO TABS: 10.0000 mg | ORAL_TABLET | Freq: Two times a day (BID) | ORAL | 0 refills | Status: DC | PRN

## 2024-03-16 NOTE — Discharge Instructions (Addendum)
 Take the muscle relaxer as needed for muscle spasm; Do not drive, operate machinery, or drink alcohol with this medication as it can cause drowsiness.   Follow up with your primary care provider.

## 2024-03-16 NOTE — ED Triage Notes (Addendum)
 Patient to Urgent Care with complaints of bilateral leg and foot pain/ muscle spasms. Reports only having pain when she is resting/ laying flat.  Symptoms started 10 days ago. Report symptoms were at their worst last night. Got out of bed approximately 8 times last night.  Stretching/ drinking electrolytes/ mustard/ pickle juice.

## 2024-03-16 NOTE — ED Provider Notes (Signed)
 Penny Fields    CSN: [id number] Arrival date & time: 03/16/24  4098      History   Chief Complaint Chief Complaint  Patient presents with   Leg Pain    Since last week I have had muscle spasms in my legs and I have done everything I can think of to make them stop, but nothing is working. They happen at night when I get in bed, and I am up 4-5 times. In my feet, legs,  and in my hands. - Entered by patient    HPI Penny Fields is a 53 y.o. female.  Patient presents with 10-day history of muscle cramping and spasm in her feet, calves, thighs.  The cramping occurs at night when she stretches her legs out.  She woke up to these muscle cramps several times last night.  No falls or injury.  No fever, lesions, rash, bruising.  Treatment attempted with electrolyte drinks, mustard, pickle juice.  The history is provided by the patient and medical records.    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      Home Medications    Prior to Admission medications   Medication Sig Start Date End Date Taking?  Authorizing Provider  cyclobenzaprine (FLEXERIL) 10 MG tablet Take 1 tablet (10 mg total) by mouth 2 (two) times daily as needed for muscle spasms. 03/16/24  Yes Wellington Half, NP  atorvastatin (LIPITOR) 20 MG tablet Take by mouth. 08/23/18 02/22/24  [provider]  Dextran 70-Hypromellose (ARTIFICIAL TEARS) 0.1-0.3 % SOLN INSTILL 2 DROPS IN BOTH EYES THREE TIMES DAILY AS NEEDED FOR DRYNESS 01/29/17   [provider]  fexofenadine (ALLEGRA) 180 MG tablet Take by mouth. 08/23/18   [provider]  fluticasone (FLONASE) 50 MCG/ACT nasal spray 2 sprays by Each Nare route daily. 01/07/17   [provider]  lisinopril (PRINIVIL,ZESTRIL) 20 MG tablet Take by mouth. 08/23/18 02/22/24  [provider]  metformin (FORTAMET) 1000 MG (OSM) 24 hr tablet Take by mouth. 01/07/17   [provider]  OZEMPIC, 2 MG/DOSE, 8 MG/3ML SOPN Inject 2 mg into the skin once a week.    [provider]  valACYclovir (VALTREX) 1000 MG tablet Take by mouth as needed. 04/08/17   [provider]    Family History Family History  Problem Relation Age of Onset   Diabetes Mother    Hypertension Mother    Kidney disease Mother    Hypertension Father    Diabetes Father  Heart failure Father    Hypertension Sister    Diabetes Sister     Social History Social History   Tobacco Use   Smoking status: Never    Passive exposure: Never   Smokeless tobacco: Never  Vaping Use   Vaping status: Never Used  Substance Use Topics   Alcohol use: Never   Drug use: Never     Allergies   Metronidazole, Oxycodone-acetaminophen, and Penicillins   Review of Systems Review of Systems  Constitutional:  Negative for chills and fever.  Musculoskeletal:  Positive for myalgias. Negative for gait problem.  Skin:  Negative for color change, rash and wound.  Neurological:  Negative for weakness and numbness.     Physical Exam Triage Vital Signs ED Triage Vitals   Encounter Vitals Group     BP 03/16/24 0947 119/84     Systolic BP Percentile --      Diastolic BP Percentile --      Pulse Rate 03/16/24 0947 86     Resp 03/16/24 0947 18     Temp 03/16/24 0947 97.7 F (36.5 C)     Temp src --      SpO2 03/16/24 0947 96 %     Weight --      Height --      Head Circumference --      Peak Flow --      Pain Score 03/16/24 0943 0     Pain Loc --      Pain Education --      Exclude from Growth Chart --    No data found.  Updated Vital Signs BP 119/84   Pulse 86   Temp 97.7 F (36.5 C)   Resp 18   SpO2 96%   Visual Acuity Right Eye Distance:   Left Eye Distance:   Bilateral Distance:    Right Eye Near:   Left Eye Near:    Bilateral Near:     Physical Exam Constitutional:      General: She is not in acute distress. HENT:     Mouth/Throat:     Mouth: Mucous membranes are moist.  Cardiovascular:     Rate and Rhythm: Normal rate and regular rhythm.     Heart sounds: Normal heart sounds.  Pulmonary:     Effort: Pulmonary effort is normal. No respiratory distress.     Breath sounds: Normal breath sounds.  Musculoskeletal:        General: No deformity. Normal range of motion.     Comments: Generalized muscle soreness in both legs.   Skin:    General: Skin is warm and dry.  Neurological:     General: No focal deficit present.     Mental Status: She is alert and oriented to person, place, and time.     Sensory: No sensory deficit.     Motor: No weakness.     Gait: Gait normal.      UC Treatments / Results  Labs (all labs ordered are listed, but only abnormal results are displayed) Labs Reviewed - No data to display  EKG   Radiology No results found.  Procedures Procedures (including critical care time)  Medications Ordered in UC Medications - No data to display  Initial Impression / Assessment and Plan / UC Course  I have reviewed the triage vital signs and the nursing notes.  Pertinent labs & imaging results  that were available during my care of the patient were reviewed by me and  considered in my medical decision making (see chart for details).    Muscle spasm.  Afebrile and vital signs are stable.  Treating today with Flexeril as needed.  Precautions for drowsiness with Flexeril discussed.  Education provided on muscle cramps and spasms.  Instructed patient to follow-up with her PCP.  She agrees to plan of care.  Final Clinical Impressions(s) / UC Diagnoses   Final diagnoses:  Muscle spasm     Discharge Instructions      Take the muscle relaxer as needed for muscle spasm; Do not drive, operate machinery, or drink alcohol with this medication as it can cause drowsiness.   Follow up with your primary care provider.       ED Prescriptions     Medication Sig Dispense Auth. Provider   cyclobenzaprine (FLEXERIL) 10 MG tablet Take 1 tablet (10 mg total) by mouth 2 (two) times daily as needed for muscle spasms. 20 tablet Wellington Half, NP      I have reviewed the PDMP during this encounter.   Wellington Half, NP 03/16/24 1020

## 2024-03-21 ENCOUNTER — Ambulatory Visit: Admitting: Allergy & Immunology

## 2024-03-21 ENCOUNTER — Other Ambulatory Visit: Payer: Self-pay

## 2024-03-21 ENCOUNTER — Encounter: Payer: Self-pay | Admitting: Allergy & Immunology

## 2024-03-21 VITALS — BP 130/74 | HR 74 | Temp 97.3°F | Resp 18 | Ht 66.54 in | Wt 286.3 lb

## 2024-03-21 DIAGNOSIS — L272 Dermatitis due to ingested food: Secondary | ICD-10-CM

## 2024-03-21 DIAGNOSIS — J310 Chronic rhinitis: Secondary | ICD-10-CM | POA: Diagnosis not present

## 2024-03-21 DIAGNOSIS — Z8679 Personal history of other diseases of the circulatory system: Secondary | ICD-10-CM | POA: Insufficient documentation

## 2024-03-21 NOTE — Progress Notes (Unsigned)
 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 - We are all working to fight this, but for now we need to do two separate visits.  - We will know more after we do testing at the next visit.  - The skin testing visit can be squeezed in at your convenience.  - Then we can make a more full plan to address all of your symptoms. - Be sure to stop your antihistamines for 3 days before this appointment.  - TAKE PICTURES in the future.   2. Chronic rhinitis - mostly in the spring time  - We will do environmental allergy testing at the next visit.  - Continue with Allegra as needed. - Continue with Flonase as needed.  3. Return in about 1 week (around 03/28/2024) for ALLERGY TESTING (1-55 + SELECT FOODS). You can have the follow up appointment with Penny Fields or a Nurse Practicioner (our Nurse Practitioners are excellent and always have Physician oversight!).    Please inform us  of any Emergency Department visits, hospitalizations, or changes in symptoms. Call us  before going to the ED for breathing or allergy symptoms since we might be able to fit you in for a sick visit. Feel free to contact us  anytime with any questions, problems, or concerns.  It was a pleasure to meet you today!  Websites that have reliable patient information: 1. American Academy of Asthma, Allergy, and Immunology: www.aaaai.org 2. Food Allergy Research and Education (FARE): foodallergy.org 3. Mothers of Asthmatics: http://www.asthmacommunitynetwork.org 4. American College of Allergy, Asthma, and Immunology: www.acaai.org      "Like" us  on  Facebook and Instagram for our latest updates!      A healthy democracy works best when [employer] participate! Make sure you are registered to vote! If you have moved or changed any of your contact information, you will need to get this updated before voting! Scan the QR codes below to learn more!             {Blank single:19197::"This note in its entirety was forwarded to the Provider who requested this consultation."}  Subjective:   Penny Fields is a 53 y.o. female presenting today for evaluation of  Chief Complaint  Patient presents with  . Rash    Once a month she would break out in blisters.    Penny Fields has a history of the following: Patient Active Problem List   Diagnosis Date Noted  . History of hypertension   . Hallux limitus of right foot 09/20/2018  . Idiopathic intracranial hypertension 12/17/2014  . Morbid obesity (HCC) 12/17/2014  . 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 Allergic rhinitis 07/04/2013  . Essential hypertension 07/04/2013  . Hyperlipidemia 07/04/2013  . Lactose intolerance 07/04/2013  . S/P total abdominal hysterectomy and bilateral salpingo-oophorectomy 07/04/2013  . Obstructive sleep apnea 12/17/2012  . Diabetes mellitus (HCC) 12/24/2009  . Goiter 06/05/1997    History obtained from: chart review and {Persons; PED relatives w/patient:19415::"patient"}.  Discussed the use of AI scribe software for clinical note transcription with the patient and/or guardian, who gave verbal consent to proceed.  Penny Fields was referred by Katrine Parody  R, MD.     Penny Fields is a 53 y.o. female presenting for {Blank single:19197::"a food challenge","a drug challenge","skin testing","a sick visit","an evaluation of ***","a follow up visit"}.    Asthma/Respiratory Symptom History: ***  Allergic Rhinitis Symptom History: ***  Food Allergy Symptom History: ***  Skin Symptom History: ***  GERD Symptom History: ***  Infection Symptom History:  ***  ***Otherwise, there is no history of other atopic diseases, including {Blank multiple:19196:o:"asthma","food allergies","drug allergies","environmental allergies","stinging insect allergies","eczema","urticaria","contact dermatitis"}. There is no significant infectious history. ***Vaccinations are up to date.    Past Medical History: Patient Active Problem List   Diagnosis Date Noted  . History of hypertension   . Hallux limitus of right foot 09/20/2018  . Idiopathic intracranial hypertension 12/17/2014  . Morbid obesity (HCC) 12/17/2014  . 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 Allergic rhinitis 07/04/2013  . Essential hypertension 07/04/2013  . Hyperlipidemia 07/04/2013  . Lactose intolerance 07/04/2013  . S/P total abdominal hysterectomy and bilateral salpingo-oophorectomy 07/04/2013  . Obstructive sleep apnea 12/17/2012  . Diabetes mellitus (HCC) 12/24/2009  . Goiter 06/05/1997    Medication List:  Allergies as of 03/21/2024       Reactions   Metronidazole Other (See Comments)   rash   Oxycodone-acetaminophen Rash   Penicillins Rash        Medication List        Accurate as of Mar 21, 2024  4:33 PM. If you have any questions, ask your nurse or doctor.          Artificial Tears 0.1-0.3 % Soln Generic drug: Dextran 70-Hypromellose INSTILL 2 DROPS IN BOTH EYES THREE TIMES DAILY AS NEEDED FOR DRYNESS   atorvastatin 20 MG tablet Commonly known as: LIPITOR Take by mouth.   cyclobenzaprine  10 MG tablet Commonly known as: FLEXERIL  Take 1 tablet (10 mg total) by mouth 2 (two) times daily as needed for muscle spasms.   fexofenadine 180 MG tablet Commonly known as: ALLEGRA Take by mouth.   fluticasone 50 MCG/ACT nasal spray Commonly known as: FLONASE 2 sprays by Each Nare route daily.   lisinopril 20 MG tablet Commonly known as: ZESTRIL Take by mouth.   metformin 1000 MG (OSM) 24 hr tablet Commonly known as: FORTAMET Take by mouth.   Ozempic (2  MG/DOSE) 8 MG/3ML Sopn Generic drug: Semaglutide (2 MG/DOSE) Inject 2 mg into the skin once a week.   valACYclovir 1000 MG tablet Commonly known as: VALTREX Take by mouth as needed.        Birth History: {Blank single:19197::"non-contributory","born premature and spent time in the NICU","born at term without complications"}  Developmental History: Natayah has met all milestones on time. She has required no {Blank multiple:19196:a:"speech therapy","occupational therapy","physical therapy"}. ***non-contributory  Past Surgical History: Past Surgical History:  Procedure Laterality Date  . ABDOMINAL HYSTERECTOMY    . CYST EXCISION     on back  . LUMBAR PUNCTURE    . MYOMECTOMY    . TONSILLECTOMY     20s     Family History: Family History  Problem Relation Age of Onset  . Asthma Mother   . Diabetes Mother   . Hypertension Mother   . Kidney disease Mother   . Hypertension Father   . Diabetes Father   . Heart failure Father   . Hypertension Sister   . Diabetes Sister      Social History: Jericha lives at home with ***.  She lives in a house that is 55 years old.  There is electric  vinyl plank in the main living areas as well as the bedroom.  They have gas heating and central cooling.  There are no animals inside or outside of the home.  There are no dust mite covers on the bedding.  There is no tobacco exposure.  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  There is no fume, chemical, or dust exposure.  She does have a HEPA filter.  She does not live near an interstate or industrial area.   Review of systems otherwise negative other than that mentioned in the HPI.    Objective:   Blood pressure 130/74, pulse 74, temperature (!) 97.3 F (36.3 C), temperature source Temporal, resp. rate 18, height 5' 6.54" (1.69 m), weight 286 lb 4.8 oz (129.9 kg), SpO2 97%. Body mass index  is 45.47 kg/m.     Physical Exam   Diagnostic studies: {Blank single:19197::"none","deferred due to recent antihistamine use","deferred due to insurance stipulations that require a separate visit for testing","labs sent instead"," "}  Spirometry: {Blank single:19197::"results normal (FEV1: ***%, FVC: ***%, FEV1/FVC: ***%)","results abnormal (FEV1: ***%, FVC: ***%, FEV1/FVC: ***%)"}.    {Blank single:19197::"Spirometry consistent with mild obstructive disease","Spirometry consistent with moderate obstructive disease","Spirometry consistent with severe obstructive disease","Spirometry consistent with possible restrictive disease","Spirometry consistent with mixed obstructive and restrictive disease","Spirometry uninterpretable due to technique","Spirometry consistent with normal pattern"}. {Blank single:19197::"Albuterol/Atrovent nebulizer","Xopenex/Atrovent nebulizer","Albuterol nebulizer","Albuterol four puffs via MDI","Xopenex four puffs via MDI"} treatment given in clinic with {Blank single:19197::"significant improvement in FEV1 per ATS criteria","significant improvement in FVC per ATS criteria","significant improvement in FEV1 and FVC per ATS criteria","improvement in FEV1, but not significant per ATS criteria","improvement in FVC, but not significant per ATS criteria","improvement in FEV1 and FVC, but not significant per ATS criteria","no improvement"}.  Allergy Studies: {Blank single:19197::"none","deferred due to recent antihistamine use","deferred due to insurance stipulations that require a separate visit for testing","labs sent instead"," "}    {Blank single:19197::"Allergy testing results were read and interpreted by myself, documented by clinical staff."," "}         Drexel Gentles, MD [facility] 

## 2024-03-21 NOTE — Patient Instructions (Addendum)
 1. 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 - We are all working to fight this, but for now we need to do two separate visits.  - We will know more after we do testing at the next visit.  - The skin testing visit can be squeezed in at your convenience.  - Then we can make a more full plan to address all of your symptoms. - Be sure to stop your antihistamines for 3 days before this appointment.  - TAKE PICTURES in the future.   2. Chronic rhinitis - mostly in the spring time  - We will do environmental allergy testing at the next visit.  - Continue with Allegra as needed. - Continue with Flonase as needed.  3. Return in about 1 week (around 03/28/2024) for ALLERGY TESTING (1-55 + SELECT FOODS). You can have the follow up appointment with Dr. Idolina Maker or a Nurse Practicioner (our Nurse Practitioners are excellent and always have Physician oversight!).    Please inform us  of any Emergency Department visits, hospitalizations, or changes in symptoms. Call us  before going to the ED for breathing or allergy symptoms since we might be able to fit you in for a sick visit. Feel free to contact us  anytime with any questions, problems, or concerns.  It was a pleasure to meet you today!  Websites that have reliable patient information: 1. American Academy of Asthma, Allergy, and Immunology: www.aaaai.org 2. Food Allergy Research and Education (FARE): foodallergy.org 3. Mothers of Asthmatics: http://www.asthmacommunitynetwork.org 4. American College of Allergy, Asthma, and Immunology: www.acaai.org      "Like" us  on Facebook and Instagram for our latest updates!      A healthy democracy works best when [employer] participate! Make sure you are registered to vote! If you have moved or changed any of your contact information, you will need to get this updated before  voting! Scan the QR codes below to learn more!

## 2024-04-11 ENCOUNTER — Ambulatory Visit: Admitting: Allergy & Immunology

## 2024-04-11 ENCOUNTER — Encounter: Payer: Self-pay | Admitting: Allergy & Immunology

## 2024-04-11 DIAGNOSIS — J310 Chronic rhinitis: Secondary | ICD-10-CM | POA: Diagnosis not present

## 2024-04-11 DIAGNOSIS — L272 Dermatitis due to ingested food: Secondary | ICD-10-CM

## 2024-04-11 NOTE — Progress Notes (Signed)
 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 - There is a the low positive predictive value of food allergy testing and hence the high possibility of false positives. - In contrast, food allergy testing has a high negative predictive value, therefore if testing is negative we can be relatively assured that they are indeed negative.  - There is no need to avoid any foods from an alleryg perspective. - Start Opzelura up to twice daily as needed for breakouts in the future.  - TAKE PICTURES in the future.   2. Chronic rhinitis - mostly in the spring time  - Testing was negative to the entire panel. - Copy o f testing results provided. - We could do more sensitive intradermal testing in the future if needed. - But I think that is overkill for now.  - Continue with Allegra as needed. - Continue with Flonase as needed.  3. Return in about 3 months (around 07/12/2024). You can have the follow up appointment with Dr. Idolina Maker or a Nurse Practicioner (our Nurse Practitioners are excellent and always have Physician oversight!).   Subjective:   Penny Fields is a 53 y.o. female presenting today for follow up of No chief complaint on file.   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  Discussed the use of AI scribe software for clinical note transcription with the patient and/or guardian, who gave verbal consent to proceed.  Penny Fields is a 53 y.o. female presenting for skin testing. She was last seen on May 13th. We could not do testing because her insurance company does not cover testing on the same day as a New Patient visit. She has been off of all antihistamines 3 days in anticipation of the testing.   Otherwise, there have been no changes to her past medical history, surgical history, family history, or social history.    Review of systems otherwise negative other than that mentioned in the HPI.    Objective:   There were no vitals taken for this visit. There is no height or weight on file to calculate BMI.    Physical exam deferred since this was a skin testing appointment only.   Diagnostic studies:   Allergy Studies:     Airborne Adult Perc - 04/11/24 1044     Time Antigen Placed 1044    Allergen Manufacturer Floyd Hutchinson    Location Back    Number of Test 55    Panel 1 Select    1. Control-Buffer 50% Glycerol Negative    2. Control-Histamine 4+  3. Bahia Negative    4. French Southern Territories Negative    5. Johnson Negative    6. [place]  Blue Negative    7. Meadow Fescue Negative    8. Perennial Rye Negative    9. Timothy Negative    10. Ragweed Mix Negative    11. Cocklebur Negative    12. Plantain,  English Negative    13. Baccharis Negative    14. Dog Fennel Negative    15. Russian Thistle Negative    16. Lamb's Quarters Negative    17. Sheep Sorrell Negative    18. Rough Pigweed Negative    19. Marsh Elder, Rough Negative    20. Mugwort, Common Negative    21. Box, Elder Negative    22.  Cedar, red Negative    23. Sweet Gum Negative    24. Pecan Pollen Negative    25. Pine Mix Negative    26. Walnut, Black Pollen Negative    27. Red Mulberry Negative    28. Ash Mix Negative    29. Birch Mix Negative    30. Beech American Negative    31. Cottonwood, Guinea-[place] Negative    32. Hickory, White Negative    33. Maple Mix Negative    34. Oak, [place] Mix Negative    35. Sycamore Eastern Negative    36. Alternaria Alternata Negative    37. Cladosporium Herbarum Negative    38. Aspergillus Mix Negative    39. Penicillium Mix Negative    40. Bipolaris Sorokiniana (Helminthosporium) Negative    41. Drechslera Spicifera (Curvularia) Negative    42. Mucor Plumbeus Negative    43. Fusarium Moniliforme Negative    44. Aureobasidium Pullulans (pullulara) Negative    45. Rhizopus Oryzae Negative    46. Botrytis Cinera Negative    47. Epicoccum Nigrum Negative    48. Phoma Betae Negative    49. Dust Mite Mix Negative    50. Cat Hair 10,000 BAU/ml Negative    51.  Dog Epithelia Negative    52. Mixed Feathers Negative    53. Horse Epithelia Negative    54. Cockroach, German Negative    55. Tobacco Leaf Negative             Food Adult Perc - 04/11/24 1000     Time Antigen Placed 1045    Allergen Manufacturer Greer    Location Back    Number of allergen test 37    1. Peanut Negative    3. Wheat Negative    5. Milk, Cow Negative    7. Egg White, Chicken Negative    8. Shellfish Mix Negative    9. Fish Mix Negative    10. Cashew Negative    11. Walnut Food Negative    12. Almond Negative    13. Hazelnut Negative    14. Pecan Food Negative    15. Pistachio Negative    16. [place] Nut Negative    18. Trout Negative    19. Tuna Negative    20. Salmon Negative    21. Flounder Negative    22. Codfish Negative    23. Shrimp Negative    24. Crab Negative    25. Lobster Negative    26. Oyster Negative    27. Scallops Negative    28. Oat  Negative    30. Barley  Negative    31. Rye  Negative    33. [place] Meat Negative    34. Chicken Meat Negative  35. Pork Negative    36. Beef Negative    37. Lamb Negative    38. Tomato Negative    47. Onion Negative    60. Strawberry Negative    66. Chocolate/Cacao Bean Negative    69. Ginger Negative    71. Pepper, Black Negative             Allergy testing results were read and interpreted by myself, documented by clinical staff.      Drexel Gentles, MD  [facility] 

## 2024-04-11 NOTE — Patient Instructions (Addendum)
 1. 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 - There is a the low positive predictive value of food allergy testing and hence the high possibility of false positives. - In contrast, food allergy testing has a high negative predictive value, therefore if testing is negative we can be relatively assured that they are indeed negative.  - There is no need to avoid any foods from an alleryg perspective. - Start Opzelura up to twice daily as needed for breakouts in the future.  - TAKE PICTURES in the future.   2. Chronic rhinitis - mostly in the spring time  - Testing was negative to the entire panel. - Copy o f testing results provided. - We could do more sensitive intradermal testing in the future if needed. - But I think that is overkill for now.  - Continue with Allegra as needed. - Continue with Flonase as needed.  3. Return in about 3 months (around 07/12/2024). You can have the follow up appointment with Dr. Idolina Maker or a Nurse Practicioner (our Nurse Practitioners are excellent and always have Physician oversight!).    Please inform us  of any Emergency Department visits, hospitalizations, or changes in symptoms. Call us  before going to the ED for breathing or allergy symptoms since we might be able to fit you in for a sick visit. Feel free to contact us  anytime with any questions, problems, or concerns.  It was a pleasure to meet you today!  Websites that have reliable patient information: 1. American Academy of Asthma, Allergy, and Immunology: www.aaaai.org 2. Food Allergy Research and Education (FARE): foodallergy.org 3. Mothers of Asthmatics: http://www.asthmacommunitynetwork.org 4. American College of Allergy, Asthma, and Immunology: www.acaai.org      "Like" us  on Facebook and Instagram for our latest updates!      A healthy democracy works best when [employer]  participate! Make sure you are registered to vote! If you have moved or changed any of your contact information, you will need to get this updated before voting! Scan the QR codes below to learn more!      Airborne Adult Perc - 04/11/24 1044     Time Antigen Placed 1044    Allergen Manufacturer Floyd Hutchinson    Location Back    Number of Test 55    Panel 1 Select    1. Control-Buffer 50% Glycerol Negative    2. Control-Histamine 4+    3. Bahia Negative    4. French Southern Territories Negative    5. Johnson Negative    6. [place]  Blue Negative    7. Meadow Fescue Negative    8. Perennial Rye Negative    9. Timothy Negative    10. Ragweed Mix Negative    11. Cocklebur Negative    12. Plantain,  English Negative    13. Baccharis Negative    14. Dog Fennel Negative    15. Russian Thistle Negative    16. Lamb's Quarters Negative    17. Sheep Sorrell Negative    18. Rough Pigweed Negative    19. Marsh Elder, Rough Negative    20. Mugwort, Common Negative    21. Box, Elder Negative    22. Cedar, red Negative    23. Sweet Gum Negative    24. Pecan Pollen Negative    25. Pine Mix Negative    26. Walnut, Black Pollen Negative    27. Red Mulberry Negative  28. Ash Mix Negative    29. Birch Mix Negative    30. Beech American Negative    31. Cottonwood, [place]iss[place] Negative    32. Hickory, White Negative    33. Maple Mix Negative    34. Oak, [place] Mix Negative    35. Sycamore Eastern Negative    36. Alternaria Alternata Negative    37. Cladosporium Herbarum Negative    38. Aspergillus Mix Negative    39. Penicillium Mix Negative    40. Bipolaris Sorokiniana (Helminthosporium) Negative    41. Drechslera Spicifera (Curvularia) Negative    42. Mucor Plumbeus Negative    43. Fusarium Moniliforme Negative    44. Aureobasidium Pullulans (pullulara) Negative    45. Rhizopus Oryzae Negative    46. Botrytis Cinera Negative    47. Epicoccum Nigrum Negative    48. Phoma Betae Negative    49. Dust Mite Mix  Negative    50. Cat Hair 10,000 BAU/ml Negative    51.  Dog Epithelia Negative    52. Mixed Feathers Negative    53. Horse Epithelia Negative    54. Cockroach, German Negative    55. Tobacco Leaf Negative             Food Adult Perc - 04/11/24 1000     Time Antigen Placed 1045    Allergen Manufacturer Greer    Location Back    Number of allergen test 37    1. Peanut Negative    3. Wheat Negative    5. Milk, Cow Negative    7. Egg White, Chicken Negative    8. Shellfish Mix Negative    9. Fish Mix Negative    10. Cashew Negative    11. Walnut Food Negative    12. Almond Negative    13. Hazelnut Negative    14. Pecan Food Negative    15. Pistachio Negative    16. [place] Nut Negative    18. Trout Negative    19. Tuna Negative    20. Salmon Negative    21. Flounder Negative    22. Codfish Negative    23. Shrimp Negative    24. Crab Negative    25. Lobster Negative    26. Oyster Negative    27. Scallops Negative    28. Oat  Negative    30. Barley Negative    31. Rye  Negative    33. [place] Meat Negative    34. Chicken Meat Negative    35. Pork Negative    36. Beef Negative    37. Lamb Negative    38. Tomato Negative    47. Onion Negative    60. Strawberry Negative    66. Chocolate/Cacao Bean Negative    69. Ginger Negative    71. Pepper, Black Negative

## 2024-05-08 NOTE — Progress Notes (Signed)
 Office Visit Note  Patient: Penny Fields             Date of Birth: 05-Oct-1971           MRN: [id number]             PCP: Kerry Rollene SAUNDERS, MD Referring: Kerry Rollene SAUNDERS, MD Visit Date: 05/15/2024 Occupation: @GUAROCC @  Subjective:  pain in right shoulder.    History of Present Illness: Penny Fields is a 53 y.o. female with osteoarthritis.  She returns today after her last visit on February 22, 2024.  She states she noted improvement in her right shoulder joint after the right glenohumeral joint injection on February 22, 2024.  She states the pain has not completely resolved.  She is still having some difficulty with abduction and especially with internal rotation.  She has difficulty reaching her back.  None of the other joints are painful.  He denies any discomfort in her knees.  She gets occasional discomfort in her right great toe.  He has not had any recent rash on her lips.    Activities of Daily Living:  Patient reports morning stiffness for  none.   Patient Denies nocturnal pain.  Difficulty dressing/grooming: Denies Difficulty climbing stairs: Denies Difficulty getting out of chair: Denies Difficulty using hands for taps, buttons, cutlery, and/or writing: Denies  Review of Systems  Constitutional: Negative.  Negative for fatigue.  HENT: Negative.  Negative for mouth sores and mouth dryness.   Eyes: Negative.  Negative for dryness.  Respiratory: Negative.  Negative for shortness of breath.   Cardiovascular:  Negative for chest pain and palpitations.  Gastrointestinal: Negative.  Negative for blood in stool, constipation and diarrhea.  Endocrine: Negative.  Negative for increased urination.  Genitourinary: Negative.  Negative for involuntary urination.  Musculoskeletal:  Positive for joint pain and joint pain. Negative for gait problem, joint swelling, myalgias, muscle weakness, morning stiffness, muscle tenderness and myalgias.  Skin: Negative.  Negative for color change,  rash, hair loss and sensitivity to sunlight.  Allergic/Immunologic: Negative.  Negative for susceptible to infections.  Neurological: Negative.  Negative for dizziness and headaches.  Hematological: Negative.  Negative for swollen glands.  Psychiatric/Behavioral: Negative.  Negative for depressed mood and sleep disturbance. The patient is not nervous/anxious.     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   Essential hypertension 07/04/2013   Hyperlipidemia 07/04/2013   Lactose intolerance 07/04/2013   S/P total abdominal hysterectomy and bilateral salpingo-oophorectomy 07/04/2013   Obstructive sleep apnea 12/17/2012   Diabetes mellitus (HCC) 12/24/2009   Goiter 06/05/1997    Past Medical History:  Diagnosis Date   Goiter    History of diabetes mellitus    History of hyperlipidemia    History of hypertension    Idiopathic intracranial hypertension    Urticaria     Family History  Problem Relation Age of Onset   Asthma Mother    Diabetes Mother    Hypertension Mother    Kidney disease Mother    Hypertension Father    Diabetes Father    Heart failure Father    Hypertension Sister    Diabetes Sister    Past Surgical History:  Procedure Laterality Date   ABDOMINAL  HYSTERECTOMY     CYST EXCISION     on back   LUMBAR PUNCTURE     MYOMECTOMY     TONSILLECTOMY     20s   Social History   Social History Narrative   Not on file   Immunization History  Administered Date(s) Administered   Influenza,inj,Quad PF,6+ Mos 08/07/2019   Influenza,inj,quad, With Preservative 08/17/2018   Influenza-Unspecified 08/23/2014, 08/13/2016, 08/23/2017   PFIZER(Purple Top)SARS-COV-2 Vaccination 01/23/2020, 02/20/2020    Pneumococcal Polysaccharide-23 08/10/2013   Typhoid Inactivated 10/04/2006     Objective: Vital Signs: BP 123/77 (BP Location: Left Arm, Patient Position: Sitting, Cuff Size: Normal)   Pulse 78   Resp 16   Ht 5' 7.5 (1.715 m)   Wt 294 lb 3.2 oz (133.4 kg)   BMI 45.40 kg/m    Physical Exam Vitals and nursing note reviewed.  Constitutional:      Appearance: She is well-developed.  HENT:     Head: Normocephalic and atraumatic.  Eyes:     Conjunctiva/sclera: Conjunctivae normal.  Cardiovascular:     Rate and Rhythm: Normal rate and regular rhythm.     Heart sounds: Normal heart sounds.  Pulmonary:     Effort: Pulmonary effort is normal.     Breath sounds: Normal breath sounds.  Abdominal:     General: Bowel sounds are normal.     Palpations: Abdomen is soft.  Musculoskeletal:     Cervical back: Normal range of motion.  Lymphadenopathy:     Cervical: No cervical adenopathy.  Skin:    General: Skin is warm and dry.     Capillary Refill: Capillary refill takes less than 2 seconds.  Neurological:     Mental Status: She is alert and oriented to person, place, and time.  Psychiatric:        Behavior: Behavior normal.      Musculoskeletal Exam: Cervical, thoracic and lumbar spine with good range of motion.  She had discomfort with abduction and internal rotation of the right shoulder.  Left shoulder joint was in good range of motion.  Elbow joints, wrist joints, MCPs PIPs and DIPs in good range of motion.  Hip joints and knee joints with good range of motion.  Right first MTP hallux valgus deformity was noted.  She had no tenderness on palpation.  There was no tenderness over ankles.  CDAI Exam: CDAI Score: -- Patient Global: --; Provider Global: -- Swollen: --; Tender: -- Joint Exam 05/15/2024   No joint exam has been documented for this visit   There is currently no information documented on the homunculus. Go to the Rheumatology activity and complete the homunculus  joint exam.  Investigation: No additional findings.  Imaging: No results found.  Recent Labs: Lab Results  Component Value Date   WBC 5.9 12/11/2012   HGB 12.4 12/11/2012   PLT 234 12/11/2012   NA 137 12/23/2020   K 5.1 12/23/2020   CL 101 12/23/2020   CO2 21 12/23/2020   GLUCOSE 100 (H) 12/23/2020   BUN 18 12/23/2020   CREATININE 0.97 12/23/2020   BILITOT <0.2 12/23/2020   ALKPHOS 79 12/23/2020   AST 20 12/23/2020   ALT 19 12/23/2020   PROT 7.3 12/23/2020   ALBUMIN 4.2 12/23/2020   CALCIUM 9.4 12/23/2020   GFRAA 78 12/23/2020   February 22, 2024 antigliadin IgA negative, anti-tTG negative, CK141, ANA negative, uric acid 7.1  Speciality Comments: No specialty comments available.  Procedures:  Large Joint Inj: R glenohumeral on 05/15/2024  9:17 AM Indications: pain Details: 27 G 1.5 in needle, posterior approach  Arthrogram: No  Medications: 1.5 mL lidocaine  1 %; 40 mg triamcinolone  acetonide 40 MG/ML Aspirate: 0 mL Outcome: tolerated well, no immediate complications  Risk of infection, tendon injury, nerve injury, hypopigmentation and dermal atrophy were discussed. Procedure, treatment alternatives, risks and benefits explained, specific risks discussed. Consent was given by the patient. Immediately prior to procedure a time out was called to verify the correct patient, procedure, equipment, support staff and site/side marked as required. Patient was prepped and draped in the usual sterile fashion.     Allergies: Metronidazole, Oxycodone-acetaminophen, and Penicillins   Assessment / Plan:     Visit Diagnoses: Chronic right shoulder pain - Patient plays pickle ball.  X-rays obtained at last visit showed AC joint arthritis.  Right glenohumeral joint injected on February 22, 2024.  Patient reports about 80% improvement in her right shoulder joint although she still has some problems with abduction and internal rotation.  She requested a repeat cortisone injection today.   After informed consent was obtained and side effects were discussed right shoulder was injected with lidocaine  and Kenalog  as described above.  Patient tolerated the procedure well.  Postprocedure instructions were given.  A handout on shoulder joint exercises was provided.  I offered physical therapy but patient would like to wait and will contact us  if she does not have any improvement with the exercises.  Primary osteoarthritis of both hands-she has mild osteoarthritic changes.  Joint protection was discussed.  Joint protection muscle strengthening was discussed.  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  Lower extremity exercises were discussed.  Hallux limitus of right foot-she continues to have some discomfort in her right first MTP joint.  Wide fitting shoes were advised.  She is also followed by Dr. Verta.  Hyperuricemia-her uric acid was mildly elevated but she has not had any gout flares.  I advised her to contact us  if she develops any new symptoms.  Primary osteoarthritis of both feet-she has intermittent discomfort in her feet.  Proper fitting shoes were advised.  Rash - History of recurrent rash on the lips.  Anti-tTG and antigliadin negative.  ANA negative.  Labs were discussed with the patient.  Myalgia - CK normal.  Essential hypertension-blood pressure was normal is 123/77.  History of hyperlipidemia  History of diabetes mellitus-patient is on Ozempic and metformin.  She was advised to monitor blood glucose level closely after the injection.  Idiopathic intracranial hypertension  Goiter  Obstructive sleep apnea  S/P total abdominal hysterectomy and bilateral salpingo-oophorectomy  Orders: Orders Placed This Encounter  Procedures   Large Joint Inj   No orders of the defined types were placed in this encounter.    Follow-Up Instructions: Return in about 6 months (around 11/15/2024) for Osteoarthritis.   Maya Nash, MD  Note - This  record has been created using Animal nutritionist.  Chart creation errors have been sought, but may not always  have been located. Such creation errors do not reflect on  the standard of medical care.

## 2024-05-15 ENCOUNTER — Encounter: Payer: Self-pay | Admitting: Rheumatology

## 2024-05-15 ENCOUNTER — Ambulatory Visit: Attending: Rheumatology | Admitting: Rheumatology

## 2024-05-15 VITALS — BP 123/77 | HR 78 | Resp 16 | Ht 67.5 in | Wt 294.2 lb

## 2024-05-15 DIAGNOSIS — M19041 Primary osteoarthritis, right hand: Secondary | ICD-10-CM | POA: Diagnosis not present

## 2024-05-15 DIAGNOSIS — M205X1 Other deformities of toe(s) (acquired), right foot: Secondary | ICD-10-CM

## 2024-05-15 DIAGNOSIS — M2241 Chondromalacia patellae, right knee: Secondary | ICD-10-CM | POA: Diagnosis not present

## 2024-05-15 DIAGNOSIS — M19071 Primary osteoarthritis, right ankle and foot: Secondary | ICD-10-CM

## 2024-05-15 DIAGNOSIS — M25511 Pain in right shoulder: Secondary | ICD-10-CM | POA: Diagnosis not present

## 2024-05-15 DIAGNOSIS — E790 Hyperuricemia without signs of inflammatory arthritis and tophaceous disease: Secondary | ICD-10-CM

## 2024-05-15 DIAGNOSIS — E049 Nontoxic goiter, unspecified: Secondary | ICD-10-CM

## 2024-05-15 DIAGNOSIS — R21 Rash and other nonspecific skin eruption: Secondary | ICD-10-CM

## 2024-05-15 DIAGNOSIS — Z8639 Personal history of other endocrine, nutritional and metabolic disease: Secondary | ICD-10-CM

## 2024-05-15 DIAGNOSIS — G4733 Obstructive sleep apnea (adult) (pediatric): Secondary | ICD-10-CM

## 2024-05-15 DIAGNOSIS — Z90710 Acquired absence of both cervix and uterus: Secondary | ICD-10-CM

## 2024-05-15 DIAGNOSIS — M2242 Chondromalacia patellae, left knee: Secondary | ICD-10-CM

## 2024-05-15 DIAGNOSIS — G8929 Other chronic pain: Secondary | ICD-10-CM

## 2024-05-15 DIAGNOSIS — M791 Myalgia, unspecified site: Secondary | ICD-10-CM

## 2024-05-15 DIAGNOSIS — Z90722 Acquired absence of ovaries, bilateral: Secondary | ICD-10-CM

## 2024-05-15 DIAGNOSIS — I10 Essential (primary) hypertension: Secondary | ICD-10-CM

## 2024-05-15 DIAGNOSIS — G932 Benign intracranial hypertension: Secondary | ICD-10-CM

## 2024-05-15 DIAGNOSIS — M19072 Primary osteoarthritis, left ankle and foot: Secondary | ICD-10-CM

## 2024-05-15 DIAGNOSIS — Z9079 Acquired absence of other genital organ(s): Secondary | ICD-10-CM

## 2024-05-15 DIAGNOSIS — M19042 Primary osteoarthritis, left hand: Secondary | ICD-10-CM

## 2024-05-15 MED ORDER — TRIAMCINOLONE ACETONIDE 40 MG/ML IJ SUSP
40.0000 mg | INTRAMUSCULAR | Status: AC | PRN
  Administered 2024-05-15: 40 mg via INTRA_ARTICULAR

## 2024-05-15 MED ORDER — LIDOCAINE HCL 1 % IJ SOLN
1.5000 mL | INTRAMUSCULAR | Status: AC | PRN
  Administered 2024-05-15: 1.5 mL

## 2024-05-15 NOTE — Patient Instructions (Signed)

## 2024-06-13 ENCOUNTER — Ambulatory Visit: Admitting: Rheumatology

## 2024-07-09 ENCOUNTER — Ambulatory Visit
Admission: EM | Admit: 2024-07-09 | Discharge: 2024-07-09 | Disposition: A | Discharge status: Home / Self Care | Attending: Emergency Medicine | Admitting: Emergency Medicine

## 2024-07-09 ENCOUNTER — Ambulatory Visit (INDEPENDENT_AMBULATORY_CARE_PROVIDER_SITE_OTHER)

## 2024-07-09 ENCOUNTER — Encounter: Payer: Self-pay | Admitting: Emergency Medicine

## 2024-07-09 DIAGNOSIS — M25531 Pain in right wrist: Secondary | ICD-10-CM | POA: Diagnosis not present

## 2024-07-09 NOTE — Discharge Instructions (Signed)
 Trays pending you will be notified of results via telephone  Wrist brace has been applied for stability and support  If there is no break in the bone then you will wear as needed until pain resolves  If there is a break in the bone then you will wear at all times until evaluated by orthopedic specialist  You may apply ice over the affected area 10 to 15-minute intervals for the next 24 hours then you may move to heat if you find it more comforting  Elevate arm on pillows whenever sitting and lying to help reduce swelling  If there is a break in the bone then you will follow-up with orthopedic specialist within 1 to 2 weeks, information was sent on front page  If there is no break in the bone you will only need to follow-up if no improvement seen after 2 weeks post injury

## 2024-07-09 NOTE — ED Triage Notes (Signed)
 Patient reports that she was playing Pickleball and fell and injured right wrist. Patient rates pain 9/10. Patient applied ice to injury.

## 2024-07-11 ENCOUNTER — Ambulatory Visit (HOSPITAL_BASED_OUTPATIENT_CLINIC_OR_DEPARTMENT_OTHER): Admitting: Physician Assistant

## 2024-07-11 ENCOUNTER — Ambulatory Visit (HOSPITAL_COMMUNITY): Payer: Self-pay

## 2024-07-11 ENCOUNTER — Ambulatory Visit: Admitting: Allergy & Immunology

## 2024-07-11 DIAGNOSIS — J309 Allergic rhinitis, unspecified: Secondary | ICD-10-CM

## 2024-07-11 NOTE — ED Provider Notes (Signed)
 [facility]    CSN: [id number] Arrival date & time: 07/09/24  1446      History   Chief Complaint Chief Complaint  Patient presents with   Arm Injury    HPI Penny Fields is a 53 y.o. female.   Patient presents for evaluation of pain and swelling to the right wrist beginning today after injury.  Endorses that she fell while playing pickle ball, landing with palm flat against the ground.  Now experiencing pain with all movement but able to complete range of motion.  Denies presence of numbness or tingling.  Has attempted to ice.  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      Home Medications    Prior to Admission medications   Medication Sig Start Date End Date Taking? Authorizing Provider  atorvastatin (LIPITOR) 20 MG tablet Take by mouth. 08/23/18 05/15/24  [provider]  cyclobenzaprine  (FLEXERIL ) 10 MG tablet Take 1 tablet (10 mg total) by mouth 2 (two) times daily as needed for muscle spasms. Patient not taking:  Reported on 05/15/2024 03/16/24   Corlis Burnard DEL, NP  Dextran 70-Hypromellose (ARTIFICIAL TEARS) 0.1-0.3 % SOLN INSTILL 2 DROPS IN BOTH EYES THREE TIMES DAILY AS NEEDED FOR DRYNESS 01/29/17   [provider]  fexofenadine (ALLEGRA) 180 MG tablet Take by mouth. 08/23/18   [provider]  fluticasone (FLONASE) 50 MCG/ACT nasal spray 2 sprays by Each Nare route daily. 01/07/17   [provider]  lisinopril (PRINIVIL,ZESTRIL) 20 MG tablet Take by mouth. 08/23/18 05/15/24  [provider]  Menaquinone-7 (VITAMIN K2 PO) Take 180 mcg by mouth. 03/17/24   [provider]  metformin (FORTAMET) 1000 MG (OSM) 24 hr tablet Take by mouth. 01/07/17   [provider]  OZEMPIC, 2 MG/DOSE, 8 MG/3ML SOPN Inject 2 mg into the skin once a week.    [provider]  valACYclovir (VALTREX) 1000 MG tablet Take by mouth as needed. 04/08/17   [provider]    Family History Family History  Problem Relation Age of Onset   Asthma Mother    Diabetes Mother    Hypertension Mother    Kidney disease Mother    Hypertension Father    Diabetes Father    Heart failure Father    Hypertension Sister    Diabetes Sister     Social History Social History   Tobacco Use   Smoking status: Never  Passive exposure: Never   Smokeless tobacco: Never  Vaping Use   Vaping status: Never Used  Substance Use Topics   Alcohol use: Never   Drug use: Never     Allergies   Metronidazole, Oxycodone-acetaminophen, and Penicillins   Review of Systems Review of Systems   Physical Exam Triage Vital Signs ED Triage Vitals  Encounter Vitals Group     BP 07/09/24 1549 123/86     Girls Systolic BP Percentile --      Girls Diastolic BP Percentile --      Boys Systolic BP Percentile --      Boys Diastolic BP Percentile --      Pulse Rate 07/09/24 1549 80     Resp 07/09/24 1549 18     Temp 07/09/24 1549 98.3 F (36.8 C)     Temp Source 07/09/24 1549 Oral      SpO2 07/09/24 1549 100 %     Weight --      Height --      Head Circumference --      Peak Flow --      Pain Score 07/09/24 1548 9     Pain Loc --      Pain Education --      Exclude from Growth Chart --    No data found.  Updated Vital Signs BP 123/86 (BP Location: Left Arm)   Pulse 80   Temp 98.3 F (36.8 C) (Oral)   Resp 18   SpO2 100%   Visual Acuity Right Eye Distance:   Left Eye Distance:   Bilateral Distance:    Right Eye Near:   Left Eye Near:    Bilateral Near:     Physical Exam Constitutional:      Appearance: Normal appearance.  Eyes:     Extraocular Movements: Extraocular movements intact.  Pulmonary:     Effort: Pulmonary effort is normal.  Musculoskeletal:     Comments: Tenderness and moderate swelling present over the radial aspect of the right wrist, 2+ radial pulse, able to complete range of motion but pain is elicited with all movement.  Strength 4 out of 5  Neurological:     Mental Status: She is alert and oriented to person, place, and time. Mental status is at baseline.      UC Treatments / Results  Labs (all labs ordered are listed, but only abnormal results are displayed) Labs Reviewed - No data to display  EKG   Radiology DG Wrist Complete Right Result Date: 07/09/2024 CLINICAL DATA:  injury EXAM: RIGHT WRIST - COMPLETE 3+ VIEW COMPARISON:  None Available. FINDINGS: Vague transverse lucency through the distal radius with query distal radial avulsion fracture along the medial aspect. No ulnar styloid fracture. No dislocation there is no evidence of severe arthropathy or other focal bone abnormality. Soft tissues are unremarkable. IMPRESSION: Likely acute nondisplaced distal radial fracture as well as avulsion fracture along the medial aspect. Electronically Signed   By: Morgane  Naveau M.D.   On: 07/09/2024 16:26    Procedures Procedures (including critical care time)  Medications Ordered in UC Medications - No data to  display  Initial Impression / Assessment and Plan / UC Course  I have reviewed the triage vital signs and the nursing notes.  Pertinent labs & imaging results that were available during my care of the patient were reviewed by me and considered in my medical decision making (see chart for details).  Acute pain of right wrist  X-ray  pending, to be notified via telephone, wrist brace applied for stability and support, if fracture noted she is to wear at all times except with hygiene however if no fracture she may wear as needed, verbalized understanding, discussed follow-up with orthopedics based on x-ray results, recommended nonpharmacological supportive care additionally Final Clinical Impressions(s) / UC Diagnoses   Final diagnoses:  Acute pain of right wrist     Discharge Instructions      Trays pending you will be notified of results via telephone  Wrist brace has been applied for stability and support  If there is no break in the bone then you will wear as needed until pain resolves  If there is a break in the bone then you will wear at all times until evaluated by orthopedic specialist  You may apply ice over the affected area 10 to 15-minute intervals for the next 24 hours then you may move to heat if you find it more comforting  Elevate arm on pillows whenever sitting and lying to help reduce swelling  If there is a break in the bone then you will follow-up with orthopedic specialist within 1 to 2 weeks, information was sent on front page  If there is no break in the bone you will only need to follow-up if no improvement seen after 2 weeks post injury   ED Prescriptions   None    PDMP not reviewed this encounter.   Teresa Shelba SAUNDERS, [place] 07/11/24 [phone]

## 2024-09-11 ENCOUNTER — Encounter: Payer: Self-pay | Admitting: Radiology

## 2024-10-26 NOTE — Therapy (Unsigned)
 OUTPATIENT OCCUPATIONAL THERAPY ORTHO EVALUATION  Patient Name: Penny Fields MRN: [id number] DOB:July 05, 1971, 53 y.o., female Today's Date: 10/26/2024  PCP: Dr Kerry MART PROVIDER: Marion PA  END OF SESSION:   Past Medical History:  Diagnosis Date   Goiter    History of diabetes mellitus    History of hyperlipidemia    History of hypertension    Idiopathic intracranial hypertension    Urticaria    Past Surgical History:  Procedure Laterality Date   ABDOMINAL HYSTERECTOMY     CYST EXCISION     on back   LUMBAR PUNCTURE     MYOMECTOMY     TONSILLECTOMY     20s   Patient Active Problem List   Diagnosis Date Noted   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  Rationale for Evaluation and Treatment: Rehabilitation  SUBJECTIVE:   SUBJECTIVE STATEMENT: *** Pt accompanied by: self  PERTINENT HISTORY: ***  PRECAUTIONS: {Therapy precautions:24002}    WEIGHT BEARING RESTRICTIONS: {Yes ***/No:24003}  PAIN:  Are you having pain? {OPRCPAIN:27236}  FALLS: Has patient fallen in last 6 months? {fallsyesno:27318}  LIVING ENVIRONMENT: Lives with: {OPRC lives with:25569::lives with their family}   PLOF: {PLOF:24004}  PATIENT GOALS: ***  NEXT MD VISIT: ***  OBJECTIVE:  Note: Objective measures were completed at Evaluation unless otherwise noted.  HAND DOMINANCE: {MISC; OT HAND  DOMINANCE:[phone]}  ADLs: {ADLs OT:31716}  FUNCTIONAL OUTCOME MEASURES: PRWHE pain and function   UPPER EXTREMITY ROM:     Active ROM Right eval Left eval  Shoulder flexion    Shoulder abduction    Shoulder adduction    Shoulder extension    Shoulder internal rotation    Shoulder external rotation    Elbow flexion    Elbow extension    Wrist flexion    Wrist extension    Wrist ulnar deviation    Wrist radial deviation    Wrist pronation    Wrist supination    (Blank rows = not tested)  Active ROM Right eval Left eval  Thumb MCP (0-60)    Thumb IP (0-80)    Thumb Radial abd/add (0-55)     Thumb Palmar abd/add (0-45)     Thumb Opposition to Small Finger     Index MCP (0-90)     Index PIP (0-100)     Index DIP (0-70)      Long MCP (0-90)      Long PIP (0-100)      Long DIP (0-70)      Ring MCP (0-90)      Ring PIP (0-100)      Ring DIP (0-70)      Little MCP (0-90)      Little PIP (0-100)      Little DIP (0-70)      (Blank rows = not tested)      HAND FUNCTION: Grip strength: Right: *** lbs; Left: *** lbs, Lateral pinch: Right: *** lbs, Left: *** lbs, and 3 point pinch:  Right: *** lbs, Left: *** lbs  COORDINATION: {otcoordination:27237}  SENSATION: {sensation:27233}  EDEMA: ***  COGNITION: Overall cognitive status: Within functional limits for tasks assessed      TREATMENT DATE: 10/27/24                                                                                                                            Modalities: {OPRCMODALITIES:31717}     PATIENT EDUCATION: Education details: findings of eval and HEP  Person educated: Patient Education method: Explanation, Demonstration, Tactile cues, Verbal cues, and Handouts Education comprehension: verbalized understanding, returned demonstration, verbal cues required, and needs further education   GOALS: Goals reviewed with patient? Yes  SHORT TERM GOALS: Target date:  ***  *** Baseline: Goal status: INITIAL  2.  *** Baseline:  Goal status: INITIAL  LONG TERM GOALS: Target date: ***  *** Baseline:  Goal status: INITIAL  2.  *** Baseline:  Goal status: INITIAL  3.  *** Baseline:  Goal status: INITIAL  4.  *** Baseline:  Goal status: INITIAL  5.  *** Baseline:  Goal status: INITIAL  6.  *** Baseline:  Goal status: INITIAL  ASSESSMENT:  CLINICAL IMPRESSION: Patient seen today for occupational therapy evaluation for ***.   PERFORMANCE DEFICITS: in functional skills including ADLs, IADLs, ROM, strength, pain, flexibility, decreased knowledge of use of DME, and UE functional use,   and psychosocial skills including environmental adaptation and routines and behaviors.   IMPAIRMENTS: are limiting patient from ADLs, IADLs, rest and sleep, play, leisure, and social participation.   COMORBIDITIES: has no other co-morbidities that affects occupational performance. Patient will benefit from skilled OT to address above impairments and improve overall function.  MODIFICATION OR ASSISTANCE TO COMPLETE EVALUATION: No modification of tasks or assist necessary to complete an evaluation.  OT OCCUPATIONAL PROFILE AND HISTORY: Problem focused assessment: Including review of records relating to presenting problem.  CLINICAL DECISION MAKING: LOW - limited treatment options, no task modification necessary  REHAB POTENTIAL: Good for goals  EVALUATION COMPLEXITY: Low      PLAN:  OT FREQUENCY: 1-2x/week  OT DURATION: 6 weeks  PLANNED INTERVENTIONS: 97168 OT Re-evaluation, 97535 self care/ADL training, 02889 therapeutic exercise, 97530 therapeutic activity, 97112 neuromuscular re-education, 97140 manual therapy, 97018 paraffin, 02960 fluidotherapy, 97034 contrast bath, 97760 Orthotic Initial, [id number] Orthotic/Prosthetic subsequent, passive range of motion, patient/family education, and DME and/or AE instructions    CONSULTED AND AGREED WITH  PLAN OF CARE: Patient    Ancel Peters, OTR/L,CLT 10/26/2024, 7:15 PM

## 2024-10-27 ENCOUNTER — Ambulatory Visit: Attending: Physician Assistant | Admitting: Occupational Therapy

## 2024-10-27 ENCOUNTER — Encounter: Payer: Self-pay | Admitting: Occupational Therapy

## 2024-10-27 DIAGNOSIS — M25631 Stiffness of right wrist, not elsewhere classified: Secondary | ICD-10-CM | POA: Diagnosis present

## 2024-10-27 DIAGNOSIS — M6281 Muscle weakness (generalized): Secondary | ICD-10-CM | POA: Insufficient documentation

## 2024-10-27 DIAGNOSIS — M79641 Pain in right hand: Secondary | ICD-10-CM | POA: Insufficient documentation

## 2024-10-27 DIAGNOSIS — M25531 Pain in right wrist: Secondary | ICD-10-CM | POA: Diagnosis present

## 2024-10-30 ENCOUNTER — Ambulatory Visit: Admitting: Occupational Therapy

## 2024-10-30 DIAGNOSIS — M6281 Muscle weakness (generalized): Secondary | ICD-10-CM

## 2024-10-30 DIAGNOSIS — M25631 Stiffness of right wrist, not elsewhere classified: Secondary | ICD-10-CM | POA: Diagnosis not present

## 2024-10-30 DIAGNOSIS — M79641 Pain in right hand: Secondary | ICD-10-CM

## 2024-10-30 DIAGNOSIS — M25531 Pain in right wrist: Secondary | ICD-10-CM

## 2024-10-30 NOTE — Therapy (Signed)
 " OUTPATIENT OCCUPATIONAL THERAPY ORTHO TREATMENT  Patient Name: Penny Fields MRN: [id number] DOB:1971-04-12, 53 y.o., female Today's Date: 10/30/2024  PCP: Dr Kerry MART PROVIDER: Marion PA  END OF SESSION:  OT End of Session - 10/30/24 1113     Visit Number 2    Number of Visits 12    Date for Recertification  12/22/24    OT Start Time 1112    Activity Tolerance Patient tolerated treatment well    Behavior During Therapy [facility] for tasks assessed/performed          Past Medical History:  Diagnosis Date   Goiter    History of diabetes mellitus    History of hyperlipidemia    History of hypertension    Idiopathic intracranial hypertension    Urticaria    Past Surgical History:  Procedure Laterality Date   ABDOMINAL HYSTERECTOMY     CYST EXCISION     on back   LUMBAR PUNCTURE     MYOMECTOMY     TONSILLECTOMY     20s   Patient Active Problem List   Diagnosis Date Noted   Dermatitis due to food taken internally 04/11/2024   History of hypertension    Hallux limitus of right foot 09/20/2018   Idiopathic intracranial hypertension 12/17/2014   Morbid obesity (HCC) 12/17/2014   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   Essential hypertension 07/04/2013   Hyperlipidemia 07/04/2013   Lactose intolerance 07/04/2013   S/P total abdominal hysterectomy and bilateral salpingo-oophorectomy 07/04/2013   Obstructive sleep apnea 12/17/2012   Diabetes mellitus (HCC) 12/24/2009   Goiter 06/05/1997    ONSET DATE: 07/11/24  REFERRING DIAG: Right distal distal radius fracture  THERAPY DIAG:  Stiffness of right wrist, not elsewhere classified  Muscle weakness (generalized)  Pain in right wrist  Pain in right hand  Rationale for Evaluation and Treatment: Rehabilitation  SUBJECTIVE:   SUBJECTIVE STATEMENT: I fell playing pickle ball and fractured my wrist.  I seen the therapist may be 4 or 5 times.  But I still cannot bend my wrist up and down or have trouble bathing myself,  cutting food, writing or picking up like a half a gallon.  In the evenings my hand hurts on the top into my wrist. Pt accompanied by: self  PERTINENT HISTORY: 08/24/24 ortho note:  08/08/2024 08/08/2024 Impression: 53 year old female with healing nondisplaced right distal radius fracture and coccyx contusion  Plan:  Discussed diagnosis in detail with the patient today. We reviewed her x-ray results.  Will transition her to a cock up wrist brace. She was instructed to work on gentle range of motion exercises as well.  Regarding her tailbone and recommend the use of a ring cushion which patient has at home. She was also instructed to use over-the-counter pain medication.  Patient will follow-up for repeat evaluation and x-rays of her wrist in 2 weeks or sooner with any questions or concerns.  Patient is in agreement with recommended treatment plan. All questions were answered. [email or web address] Not available 08/08/2024 13:06:18  08/24/2024 08/24/2024 Impression: 53 year old female with healing nondisplaced right distal radius fracture and coccyx contusion  Plan:  Discussed diagnosis in detail with the patient today. We reviewed her x-ray results.  Patient instructed she may discontinue her wrist brace. She was given physical therapy home exercise program to begin.  She may return to activity as tolerated.  She will follow-up for repeat evaluation in our office in the future as needed or sooner with any questions or concerns.  PRECAUTIONS: None    WEIGHT BEARING RESTRICTIONS: No  PAIN:  Are you having pain? 4/10 pain dorsal hand and wrist and forearm  FALLS: Has patient fallen in last 6 months?  1 time frame pickleball early September  LIVING ENVIRONMENT: Lives with: lives alone   PLOF: Had normal range of motion in right hand.  Prior to fall.  Right-hand-dominant.  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  Likes to play pickle ball or free time and shopping doing her own  housework  PATIENT GOALS: I want to get my flexibility back in my wrist and the strength that I can base myself and, right and pick up half a gallon or a gallon and play pickle ball again  NEXT MD VISIT: Do not have a follow-up  OBJECTIVE:  Note: Objective measures were completed at Evaluation unless otherwise noted.  HAND DOMINANCE: Right  ADLs: I cannot bathe myself with my right hand, perform hygiene, apply deodorant, squeeze a washcloth or pick up a gallon of milk.  Or cut food or signing or writing.  Or reaching behind the back.  Cannot play pickle ball or opening a jar   FUNCTIONAL OUTCOME MEASURES: PRWHE pain  23/50 and function 40/50  UPPER EXTREMITY ROM:     Active ROM Right eval Left eval R 10/30/24  Shoulder flexion     Shoulder abduction     Shoulder adduction     Shoulder extension     Shoulder internal rotation     Shoulder external rotation     Elbow flexion     Elbow extension     Wrist flexion 45  60  Wrist extension 35  40  Wrist ulnar deviation 25    Wrist radial deviation 18    Wrist pronation 90    Wrist supination 90    (Blank rows = not tested)  Active ROM Right eval Left eval  Thumb MCP (0-60)    Thumb IP (0-80)    Thumb Radial abd/add (0-55)     Thumb Palmar abd/add (0-45)     Thumb Opposition to Small Finger     Index MCP (0-90) Pain with composite flexion , wrist ext     Index PIP (0-100)     Index DIP (0-70)      Long MCP (0-90)      Long PIP (0-100)      Long DIP (0-70)      Ring MCP (0-90)      Ring PIP (0-100)      Ring DIP (0-70)      Little MCP (0-90)      Little PIP (0-100)      Little DIP (0-70)      (Blank rows = not tested)      HAND FUNCTION: Eval Grip strength: Right: 41 lbs; Left: 61 lbs, Lateral pinch: Right: 14 lbs, Left: 18 lbs, and 3 point pinch: Right: 9 lbs, Left: 15 lbs  COORDINATION: WFL  SENSATION: Denies any sensory issues  EDEMA: R hand around [facility] R 20 cm and L 19 cm ; wrist R 19 cm , L 17.2 cm  , forearm same  Wrist decrease to 18 cm and MC to 19.5 cm   COGNITION: Overall cognitive status: Within functional limits for tasks assessed      TREATMENT DATE: 10/30/24          Assess progress in AROM for R wrist since [facility]- progres  See flowsheet   Edema decrease at wrist and  hand                                                                                                           Modalities: Fluidotherapy:  Time: 8 Location: Right wrist and hand Active range of motion for wrist wrist and forearm in all planes to decrease stiffness increase motion    Done soft tissue mobs done to [facility] and carpal spreads  Gentle traction with mobs for CT rows - with stretches for RD, UD and flexion , ext  Tolerate well  Pt to cont with contrast 2- 3 times a day to decrease edema in the wrist prior to range of motion. Patient fitted with new Tubigrip D to wear during the day but also at night to decrease edema in the wrist or circumference. AAROM for wrist flexion extension radial ulnar deviation over edge of table 1012 reps hold 3 to 5 seconds.  Slight pull. Review and pt to focus on UD And double time for wrist ext   Add table slides - 3 x day 2 reps Can cont prolonged flexion stretch and extension stretch 3 x day   3 x 30 seconds Add and upgrade to 1 lbs weight for wrist RD, UD and sup/pro 2 x 15 reps need v/c to maintain wrist ext and neutral position  And wrist ext , flexion 12 reps 2 x day after stretches  Can increase another set in 3 days and 3rd set for all in 5-6 days             PATIENT EDUCATION: Education details: findings of eval and HEP  Person educated: Patient Education method: Explanation, Demonstration, Tactile cues, Verbal cues, and Handouts Education comprehension: verbalized understanding, returned demonstration, verbal cues required, and needs further education   GOALS: Goals reviewed with patient? Yes  SHORT TERM GOALS: Target date: 2  weeks  Patient to be independent in home program to decrease edema now 1 cm in right wrist and increase motion in wrist flexion extension Baseline: No knowledge of home program.  Wrist increase by 1.8 cm.  Patient with pain 4/10 over dorsal hand to the wrist.  Wrist flexion 45 degrees and extension 35 degrees Goal status: INITIAL  LONG TERM GOALS: Target date: 8 wks  Patient's right wrist flexion extension increased to within normal range for patient to dry her back, applied deodorant and use her right hand for bathing Baseline: Patient unable to reach for her back, use left hand for bathing.  And applying deodorant.  Wrist flexion 45 degrees and extension 35 Goal status: INITIAL  2.  Patient's pain in right wrist and hand improved on PRWHE more than 15 points Baseline: Patient's PRWHE pain score 23/50 with most of her pain with wrist flexion extension over the dorsal hand into the wrist and forearm.  4/10. Goal status: INITIAL  3.  Right wrist and forearm strength improved to at least 4+/5 for patient to be able to push and pull heavy door and turn doorknobs symptom-free Baseline: Right wrist flexion 45 degrees and extension 35.  With 4/10 pain at times.  Radial deviation 18 degrees ulnar deviation 25.  With strength 4 -/5 in range.  Supination pronation 90 degrees but 4 -/5 strength. Goal status: INITIAL  4.  Patient's right grip and 3-point pinch improved to within normal range for her age for patient to be able to carry a gallon of milk, carry a plate, cutting food and pour a drink symptom-free Baseline: Right grip 41 pounds and left 61 pounds.  3-point pinch right 9 pounds left 15 pounds Goal status: INITIAL  5.  Pt PRWHE function score improved with more than 30 points Baseline:  PRWHE function score at evaluation 40/50 Goal status: INITIAL    ASSESSMENT:  CLINICAL IMPRESSION: Patient seen today for occupational therapy evaluation for right dominant hand distal radius fracture  first week of September 2025.  Patient report patient was in a cast and then prefab wrist splint.  Had in the past 4-5 sessions of therapy.  Patient present with decreased wrist flexion extension worse than radial ulnar deviation.  Patient was decreased strength in the wrist and forearm as well as grip and prehension strength.  Patient reports pain 4/10 to 6/10 at the most over the dorsal hand into the wrist and forearm at the end of the day.   NOW graeat progress in decrease edema , increase wrist flexion more than ext - upgrade HEP for wrist ext table slides and 1 lbs weight for wrist in all planes - increase over the week to 2nd and 3rd set pain free   Patient with increased edema at the R wrist.  Patient limited in functional use of right dominant hand in ADLs and IADLs.  Patient can get benefit from skilled OT services to decrease edema and pain and increased motion and strength in right dominant hand and wrist patient to return to prior level of function.  PERFORMANCE DEFICITS: in functional skills including ADLs, IADLs, ROM, strength, pain, flexibility, decreased knowledge of use of DME, and UE functional use,   and psychosocial skills including environmental adaptation and routines and behaviors.   IMPAIRMENTS: are limiting patient from ADLs, IADLs, rest and sleep, play, leisure, and social participation.   COMORBIDITIES: has no other co-morbidities that affects occupational performance. Patient will benefit from skilled OT to address above impairments and improve overall function.  MODIFICATION OR ASSISTANCE TO COMPLETE EVALUATION: No modification of tasks or assist necessary to complete an evaluation.  OT OCCUPATIONAL PROFILE AND HISTORY: Problem focused assessment: Including review of records relating to presenting problem.  CLINICAL DECISION MAKING: LOW - limited treatment options, no task modification necessary  REHAB POTENTIAL: Good for goals  EVALUATION COMPLEXITY: Low       PLAN:  OT FREQUENCY: 1-2x/week  OT DURATION: 8 wks  PLANNED INTERVENTIONS: 97168 OT Re-evaluation, 97535 self care/ADL training, 02889 therapeutic exercise, 97530 therapeutic activity, 97112 neuromuscular re-education, 97140 manual therapy, 97018 paraffin, 02960 fluidotherapy, 97034 contrast bath, 97760 Orthotic Initial, [id number] Orthotic/Prosthetic subsequent, passive range of motion, patient/family education, and DME and/or AE instructions    CONSULTED AND AGREED WITH PLAN OF CARE: Patient    Ancel Peters, OTR/L,CLT 10/30/2024, 11:13 AM   "

## 2024-11-06 NOTE — Progress Notes (Signed)
 "  Office Visit Note  Patient: Penny Fields             Date of Birth: Oct 11, 1971           MRN: [id number]             PCP: Kerry Rollene SAUNDERS, MD Referring: Kerry Rollene SAUNDERS, MD Visit Date: 11/17/2024 Occupation: Data Unavailable  Subjective:  Pain in joints  History of Present Illness: Penny Fields is a 53 y.o. female with osteoarthritis.  She returns today after her last visit in July 2025.  She states her right shoulder joint pain improved after the cortisone injection.  She states in August she fell while playing pickle ball.  She landed on her right arm and her tailbone.  She was diagnosed with a right radius fracture . X-rays from July 09, 2024 showed likely acute nondisplaced distal radius fracture as well as avulsion fracture along the medial aspect per radiology report.  She was in a cast for about 6 weeks.  She is going to occupational therapy.  Her range of motion has improved and also the discomfort is improved.  She states she also had coccygeal injury and continues to have some coccygeal pain.  She denies any discomfort in her knees today.  She has occasional discomfort in her feet.  She is working with a systems analyst now.  She has not had any gout flare.  She has had no recurrence of rash on her lips.    Activities of Daily Living:  Patient reports morning stiffness for 5 minutes.   Patient Denies nocturnal pain.  Difficulty dressing/grooming: Denies Difficulty climbing stairs: Denies Difficulty getting out of chair: Denies Difficulty using hands for taps, buttons, cutlery, and/or writing: Denies  Review of Systems  Constitutional:  Negative for fatigue.  HENT:  Negative for mouth sores and mouth dryness.   Eyes:  Positive for dryness.  Respiratory:  Negative for shortness of breath.   Cardiovascular:  Negative for chest pain and palpitations.  Gastrointestinal:  Negative for blood in stool, constipation and diarrhea.  Endocrine: Negative for increased urination.   Genitourinary:  Negative for involuntary urination.  Musculoskeletal:  Positive for joint swelling, muscle weakness and morning stiffness. Negative for joint pain, gait problem, joint pain, myalgias, muscle tenderness and myalgias.  Skin:  Positive for hair loss. Negative for color change, rash and sensitivity to sunlight.  Allergic/Immunologic: Negative for susceptible to infections.  Neurological:  Negative for dizziness and headaches.  Hematological:  Negative for swollen glands.  Psychiatric/Behavioral:  Positive for sleep disturbance. Negative for depressed mood. The patient is not nervous/anxious.     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   Essential hypertension 07/04/2013   Hyperlipidemia 07/04/2013   Lactose intolerance 07/04/2013   S/P total abdominal hysterectomy and bilateral salpingo-oophorectomy 07/04/2013   Obstructive sleep apnea 12/17/2012   Diabetes mellitus (HCC) 12/24/2009   Goiter 06/05/1997    Past Medical History:  Diagnosis Date   Goiter    History of diabetes mellitus    History of hyperlipidemia    History of hypertension    Idiopathic intracranial hypertension    Urticaria     Family History  Problem Relation Age of Onset  Asthma Mother    Diabetes Mother    Hypertension Mother    Kidney disease Mother    Hypertension Father    Diabetes Father    Heart failure Father    Hypertension Sister    Diabetes Sister    Past Surgical History:  Procedure Laterality Date   ABDOMINAL HYSTERECTOMY     CYST EXCISION     on back   LUMBAR PUNCTURE     MYOMECTOMY     TONSILLECTOMY     35s   Social History[1] Social History   Social History Narrative   Not on file      Immunization History  Administered Date(s) Administered   Influenza,inj,Quad PF,6+ Mos 08/07/2019   Influenza,inj,quad, With Preservative 08/17/2018   Influenza-Unspecified 08/23/2014, 08/13/2016, 08/23/2017   PFIZER(Purple Top)SARS-COV-2 Vaccination 01/23/2020, 02/20/2020   Pneumococcal Polysaccharide-23 08/10/2013   Typhoid Inactivated 10/04/2006     Objective: Vital Signs: BP (!) 145/90   Pulse 77   Temp 97.8 F (36.6 C)   Resp 15   Ht 5' 7.5 (1.715 m)   Wt 293 lb 6.4 oz (133.1 kg)   BMI 45.27 kg/m    Physical Exam Vitals and nursing note reviewed.  Constitutional:      Appearance: She is well-developed.  HENT:     Head: Normocephalic and atraumatic.  Eyes:     Conjunctiva/sclera: Conjunctivae normal.  Cardiovascular:     Rate and Rhythm: Normal rate and regular rhythm.     Heart sounds: Normal heart sounds.  Pulmonary:     Effort: Pulmonary effort is normal.     Breath sounds: Normal breath sounds.  Abdominal:     General: Bowel sounds are normal.     Palpations: Abdomen is soft.  Musculoskeletal:     Cervical back: Normal range of motion.  Lymphadenopathy:     Cervical: No cervical adenopathy.  Skin:    General: Skin is warm and dry.     Capillary Refill: Capillary refill takes less than 2 seconds.  Neurological:     Mental Status: She is alert and oriented to person, place, and time.  Psychiatric:        Behavior: Behavior normal.      Musculoskeletal Exam: Cervical, thoracic and lumbar spine were in good range of motion.  There was no SI joint tenderness.  Shoulder joints, elbow joints, left wrist joint, MCPs, PIPs and DIPs were in good range of motion with no synovitis.  She had limited range of motion of the right wrist joint without synovitis.  Hip joints and knee joints were in good range of motion without any warmth swelling or effusion.  There was no tenderness over ankles or MTPs.   CDAI Exam: CDAI Score: -- Patient Global: --; Provider  Global: -- Swollen: --; Tender: -- Joint Exam 11/17/2024   No joint exam has been documented for this visit   There is currently no information documented on the homunculus. Go to the Rheumatology activity and complete the homunculus joint exam.  Investigation: No additional findings.  Imaging: No results found.  Recent Labs: Lab Results  Component Value Date   WBC 5.9 12/11/2012   HGB 12.4 12/11/2012   PLT 234 12/11/2012   NA 137 12/23/2020   K 5.1 12/23/2020   CL 101 12/23/2020   CO2 21 12/23/2020   GLUCOSE 100 (H) 12/23/2020   BUN 18 12/23/2020   CREATININE 0.97 12/23/2020   BILITOT <0.2 12/23/2020   ALKPHOS 79 12/23/2020   AST 20  12/23/2020   ALT 19 12/23/2020   PROT 7.3 12/23/2020   ALBUMIN 4.2 12/23/2020   CALCIUM 9.4 12/23/2020   GFRAA 78 12/23/2020    Speciality Comments: No specialty comments available.  Procedures:  No procedures performed Allergies: Metronidazole, Oxycodone-acetaminophen, and Penicillins   Assessment / Plan:     Visit Diagnoses: Chronic right shoulder pain - Patient plays pickle ball.  X-rays obtained showed AC joint arthritis.  She had right shoulder injection at the last visit.  She states the discomfort in her shoulder resolved after the injection.  Nondisplaced fracture of distal end of right radius -patient fell while playing pickle ball and landed on her right arm and tailbone.  She had nondisplaced fracture of the radius as described above.  She had routine healing.  She was in a cast and then a brace.  She has limited range of motion of her right wrist joint.  She has been going to occupational therapy which has been helpful.  Primary osteoarthritis of both hands -she continues to have the pain and stiffness in her hands.  She has mild osteoarthritic changes.  Chondromalacia of both patellae-she denies discomfort today.  Hallux limitus of right foot - She is also followed by Dr. Verta.  Coccygeal pain-patient has been having  coccygeal pain since the fall.  Hyperuricemia-she has been watching diet and has not had any gout flare.  Primary osteoarthritis of both feet-proper fitting shoes were advised.  Rash -she had no recurrence of rash on the lips.  Anti-tTG and antigliadin negative.  ANA negative.  Myalgia -she continues to have some myalgias.  According the patient the myalgias have improved since she has been taking magnesium.  CK normal.  Benefits of water aerobics and setting were discussed.  Other medical problems listed as follows:  Essential hypertension-blood pressure was elevated at 145/90.  She was advised to monitor pressure closely and follow-up with her PCP.  History of hyperlipidemia  Goiter  Idiopathic intracranial hypertension  History of diabetes mellitus  S/P total abdominal hysterectomy and bilateral salpingo-oophorectomy  Obstructive sleep apnea  Osteoporosis screening-I will schedule a DEXA scan as she had recent fall and fracture.  If she is in the osteopenia range she will need bisphosphonates.  Postmenopausal  BMI 45.27-per patient's request we will refer her to weight management center.  Orders: No orders of the defined types were placed in this encounter.  No orders of the defined types were placed in this encounter.    Follow-Up Instructions: Return in about 6 months (around 05/17/2025) for Osteoarthritis.   Maya Nash, MD  Note - This record has been created using Animal nutritionist.  Chart creation errors have been sought, but may not always  have been located. Such creation errors do not reflect on  the standard of medical care.     [1]  Social History Tobacco Use   Smoking status: Never    Passive exposure: Never   Smokeless tobacco: Never  Vaping Use   Vaping status: Never Used  Substance Use Topics   Alcohol use: Never   Drug use: Never   "

## 2024-11-07 ENCOUNTER — Ambulatory Visit: Admitting: Occupational Therapy

## 2024-11-16 ENCOUNTER — Ambulatory Visit: Attending: Physician Assistant | Admitting: Occupational Therapy

## 2024-11-16 DIAGNOSIS — M6281 Muscle weakness (generalized): Secondary | ICD-10-CM | POA: Insufficient documentation

## 2024-11-16 DIAGNOSIS — M25531 Pain in right wrist: Secondary | ICD-10-CM | POA: Insufficient documentation

## 2024-11-16 DIAGNOSIS — M79641 Pain in right hand: Secondary | ICD-10-CM | POA: Insufficient documentation

## 2024-11-16 DIAGNOSIS — M25631 Stiffness of right wrist, not elsewhere classified: Secondary | ICD-10-CM | POA: Insufficient documentation

## 2024-11-16 NOTE — Therapy (Signed)
 " OUTPATIENT OCCUPATIONAL THERAPY ORTHO TREATMENT  Patient Name: Penny Fields MRN: [id number] DOB:08/15/1971, 54 y.o., female Today's Date: 11/16/2024  PCP: Dr Kerry MART PROVIDER: Marion PA  END OF SESSION:  OT End of Session - 11/16/24 1004     Visit Number 3    Number of Visits 12    Date for Recertification  12/22/24    OT Start Time 0955    OT Stop Time 1035    OT Time Calculation (min) 40 min    Activity Tolerance Patient tolerated treatment well    Behavior During Therapy WFL for tasks assessed/performed          Past Medical History:  Diagnosis Date   Goiter    History of diabetes mellitus    History of hyperlipidemia    History of hypertension    Idiopathic intracranial hypertension    Urticaria    Past Surgical History:  Procedure Laterality Date   ABDOMINAL HYSTERECTOMY     CYST EXCISION     on back   LUMBAR PUNCTURE     MYOMECTOMY     TONSILLECTOMY     20s   Patient Active Problem List   Diagnosis Date Noted   Dermatitis due to food taken internally 04/11/2024   History of hypertension    Hallux limitus of right foot 09/20/2018   Idiopathic intracranial hypertension 12/17/2014   Morbid obesity (HCC) 12/17/2014   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   Essential hypertension 07/04/2013   Hyperlipidemia 07/04/2013   Lactose intolerance 07/04/2013   S/P total abdominal hysterectomy and bilateral salpingo-oophorectomy 07/04/2013   Obstructive sleep apnea 12/17/2012   Diabetes mellitus (HCC) 12/24/2009   Goiter 06/05/1997    ONSET DATE: 07/11/24  REFERRING DIAG: Right distal distal radius fracture  THERAPY DIAG:  Stiffness of right wrist, not elsewhere classified  Muscle weakness (generalized)  Pain in right wrist  Pain in right hand  Rationale for Evaluation and Treatment: Rehabilitation  SUBJECTIVE:   SUBJECTIVE STATEMENT: I can tell I am stronger and more motion -as well as able to reach behind my back now and bathing with my R hand - I  am starting to work out with trainer today if you can tell me what I can and cannot do yet with my wrist and hand  Pt accompanied by: self  PERTINENT HISTORY: 08/24/24 ortho note:  08/08/2024 08/08/2024 Impression: 54 year old female with healing nondisplaced right distal radius fracture and coccyx contusion  Plan:  Discussed diagnosis in detail with the patient today. We reviewed her x-ray results.  Will transition her to a cock up wrist brace. She was instructed to work on gentle range of motion exercises as well.  Regarding her tailbone and recommend the use of a ring cushion which patient has at home. She was also instructed to use over-the-counter pain medication.  Patient will follow-up for repeat evaluation and x-rays of her wrist in 2 weeks or sooner with any questions or concerns.  Patient is in agreement with recommended treatment plan. All questions were answered. [email or web address] Not available 08/08/2024 13:06:18  08/24/2024 08/24/2024 Impression: 54 year old female with healing nondisplaced right distal radius fracture and coccyx contusion  Plan:  Discussed diagnosis in detail with the patient today. We reviewed her x-ray results.  Patient instructed she may discontinue her wrist brace. She was given physical therapy home exercise program to begin.  She may return to activity as tolerated.  She will follow-up for repeat evaluation in our office in the future as needed or  sooner with any questions or concerns.      PRECAUTIONS: None    WEIGHT BEARING RESTRICTIONS: No  PAIN:  Are you having pain? 4/10 pain dorsal hand and wrist and forearm  FALLS: Has patient fallen in last 6 months?  1 time frame pickleball early September  LIVING ENVIRONMENT: Lives with: lives alone   PLOF: Had normal range of motion in right hand.  Prior to fall.  Right-hand-dominant.  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  Likes to play pickle ball or free time and shopping doing her own  housework  PATIENT GOALS: I want to get my flexibility back in my wrist and the strength that I can base myself and, right and pick up half a gallon or a gallon and play pickle ball again  NEXT MD VISIT: Do not have a follow-up  OBJECTIVE:  Note: Objective measures were completed at Evaluation unless otherwise noted.  HAND DOMINANCE: Right  ADLs: I cannot bathe myself with my right hand, perform hygiene, apply deodorant, squeeze a washcloth or pick up a gallon of milk.  Or cut food or signing or writing.  Or reaching behind the back.  Cannot play pickle ball or opening a jar   FUNCTIONAL OUTCOME MEASURES: PRWHE pain  23/50 and function 40/50  UPPER EXTREMITY ROM:     Active ROM Right eval Left eval R 10/30/24 R 11/16/24  Shoulder flexion      Shoulder abduction      Shoulder adduction      Shoulder extension      Shoulder internal rotation      Shoulder external rotation      Elbow flexion      Elbow extension      Wrist flexion 45  60 73  Wrist extension 35  40 50  Wrist ulnar deviation 25   30  Wrist radial deviation 18   20  Wrist pronation 90   90  Wrist supination 90   90  (Blank rows = not tested)  Active ROM Right eval Left eval  Thumb MCP (0-60)    Thumb IP (0-80)    Thumb Radial abd/add (0-55)     Thumb Palmar abd/add (0-45)     Thumb Opposition to Small Finger     Index MCP (0-90) Pain with composite flexion , wrist ext     Index PIP (0-100)     Index DIP (0-70)      Long MCP (0-90)      Long PIP (0-100)      Long DIP (0-70)      Ring MCP (0-90)      Ring PIP (0-100)      Ring DIP (0-70)      Little MCP (0-90)      Little PIP (0-100)      Little DIP (0-70)      (Blank rows = not tested)      HAND FUNCTION: Eval Grip strength: Right: 41 lbs; Left: 61 lbs, Lateral pinch: Right: 14 lbs, Left: 18 lbs, and 3 point pinch: Right: 9 lbs, Left: 15 lbs 11/16/24 Grip strength: Right: 58lbs; Left: 61 lbs, Lateral pinch: Right: 20lbs, Left: 18 lbs, and 3  point pinch: Right: 14 lbs, Left: 15 lbs  COORDINATION: WFL  SENSATION: Denies any sensory issues  EDEMA: R hand around [facility] R 20 cm and L 19 cm ; wrist R 19 cm , L 17.2 cm , forearm same  Wrist decrease to 18 cm and MC  to 19.5 cm   COGNITION: Overall cognitive status: Within functional limits for tasks assessed      TREATMENT DATE: 11/16/24      Assess progress in AROM for R wrist - great progress As well as grip and prehension strength   See flowsheet   Edema decrease at wrist and hand                                                                                                           Modalities: Paraffin Time: 8 Location: Right wrist and hand Prolonged flexion and ext stretch    Done soft tissue mobs done to [facility] and carpal spreads  Gentle traction with mobs for CT rows - with stretches for RD, UD and flexion , ext  As well as carrying 4 lbs while performing joint mobs to wrist and Carpal rows  Tolerate well   Pt to cont with contrast 2- 3 times a day to decrease edema in the wrist prior to range of motion. Patient fitted with new Tubigrip D to wear during the day but also at night to decrease edema in the wrist or circumference.  HEP After prolonged stretch for wrist flexion and ext  Add AAROM moving elbow and forearm on wrist - for flexion , ext  on pillow  Review and pt to  cont to focus on UD  And double time for wrist ext   Add table slides 20 reps and needed review again - 3 x day 2 reps Upgrade to 2 lbs weight for wrist RD, UD and sup/pro and flexion and ext 15 reps reps need v/c to maintain wrist ext and neutral position  Increase in 3 days to 2nd set and  Then 5-6 days symptoms free 3rd set   With trainer forearm weight bearing and 4 lbs for elbow and shoulders             PATIENT EDUCATION: Education details: findings of eval and HEP  Person educated: Patient Education method: Explanation, Demonstration, Tactile cues, Verbal cues, and  Handouts Education comprehension: verbalized understanding, returned demonstration, verbal cues required, and needs further education   GOALS: Goals reviewed with patient? Yes  SHORT TERM GOALS: Target date: 2 weeks  Patient to be independent in home program to decrease edema now 1 cm in right wrist and increase motion in wrist flexion extension Baseline: No knowledge of home program.  Wrist increase by 1.8 cm.  Patient with pain 4/10 over dorsal hand to the wrist.  Wrist flexion 45 degrees and extension 35 degrees Goal status: INITIAL  LONG TERM GOALS: Target date: 8 wks  Patient's right wrist flexion extension increased to within normal range for patient to dry her back, applied deodorant and use her right hand for bathing Baseline: Patient unable to reach for her back, use left hand for bathing.  And applying deodorant.  Wrist flexion 45 degrees and extension 35 Goal status: INITIAL  2.  Patient's pain in right wrist and hand improved on PRWHE more than 15 points Baseline: Patient's PRWHE pain  score 23/50 with most of her pain with wrist flexion extension over the dorsal hand into the wrist and forearm.  4/10. Goal status: INITIAL  3.  Right wrist and forearm strength improved to at least 4+/5 for patient to be able to push and pull heavy door and turn doorknobs symptom-free Baseline: Right wrist flexion 45 degrees and extension 35.  With 4/10 pain at times.  Radial deviation 18 degrees ulnar deviation 25.  With strength 4 -/5 in range.  Supination pronation 90 degrees but 4 -/5 strength. Goal status: INITIAL  4.  Patient's right grip and 3-point pinch improved to within normal range for her age for patient to be able to carry a gallon of milk, carry a plate, cutting food and pour a drink symptom-free Baseline: Right grip 41 pounds and left 61 pounds.  3-point pinch right 9 pounds left 15 pounds Goal status: INITIAL  5.  Pt PRWHE function score improved with more than 30  points Baseline:  PRWHE function score at evaluation 40/50 Goal status: INITIAL    ASSESSMENT:  CLINICAL IMPRESSION: Patient seen today for occupational therapy evaluation for right dominant hand distal radius fracture first week of September 2025.  Patient report patient was in a cast and then prefab wrist splint.  Had in the past 4-5 sessions of therapy.  Patient present with decreased wrist flexion extension worse than radial ulnar deviation.  Patient was decreased strength in the wrist and forearm as well as grip and prehension strength.  Patient reports pain 4/10 to 6/10 at the most over the dorsal hand into the wrist and forearm at the end of the day.   NOW graeat progress in  wrist AROM and grip and prehension strength -decrease edema - upgrade HEP to AAROM moving elbow on wrist on pillow with great success , as well as review again wrist ext table slides and upgrade to 2  lbs weight for wrist in all planes - increase over the week to 2nd and 3rd set pain free   Patient with increased edema at the R wrist.  Patient limited in functional use of right dominant hand in ADLs and IADLs.  Patient can get benefit from skilled OT services to decrease edema and pain and increased motion and strength in right dominant hand and wrist patient to return to prior level of function.  PERFORMANCE DEFICITS: in functional skills including ADLs, IADLs, ROM, strength, pain, flexibility, decreased knowledge of use of DME, and UE functional use,   and psychosocial skills including environmental adaptation and routines and behaviors.   IMPAIRMENTS: are limiting patient from ADLs, IADLs, rest and sleep, play, leisure, and social participation.   COMORBIDITIES: has no other co-morbidities that affects occupational performance. Patient will benefit from skilled OT to address above impairments and improve overall function.  MODIFICATION OR ASSISTANCE TO COMPLETE EVALUATION: No modification of tasks or assist  necessary to complete an evaluation.  OT OCCUPATIONAL PROFILE AND HISTORY: Problem focused assessment: Including review of records relating to presenting problem.  CLINICAL DECISION MAKING: LOW - limited treatment options, no task modification necessary  REHAB POTENTIAL: Good for goals  EVALUATION COMPLEXITY: Low      PLAN:  OT FREQUENCY: 1-2x/week  OT DURATION: 8 wks  PLANNED INTERVENTIONS: 97168 OT Re-evaluation, 97535 self care/ADL training, 02889 therapeutic exercise, 97530 therapeutic activity, 97112 neuromuscular re-education, 97140 manual therapy, 97018 paraffin, 02960 fluidotherapy, 97034 contrast bath, 97760 Orthotic Initial, [id number] Orthotic/Prosthetic subsequent, passive range of motion, patient/family education, and DME and/or AE instructions  CONSULTED AND AGREED WITH PLAN OF CARE: Patient    Ancel Peters, OTR/L,CLT 11/16/2024, 10:36 AM  0 "

## 2024-11-17 ENCOUNTER — Ambulatory Visit: Attending: Rheumatology | Admitting: Rheumatology

## 2024-11-17 ENCOUNTER — Encounter: Payer: Self-pay | Admitting: Rheumatology

## 2024-11-17 VITALS — BP 145/90 | HR 77 | Temp 97.8°F | Resp 15 | Ht 67.5 in | Wt 293.4 lb

## 2024-11-17 DIAGNOSIS — I10 Essential (primary) hypertension: Secondary | ICD-10-CM | POA: Diagnosis not present

## 2024-11-17 DIAGNOSIS — S52501A Unspecified fracture of the lower end of right radius, initial encounter for closed fracture: Secondary | ICD-10-CM | POA: Diagnosis not present

## 2024-11-17 DIAGNOSIS — M2241 Chondromalacia patellae, right knee: Secondary | ICD-10-CM | POA: Diagnosis not present

## 2024-11-17 DIAGNOSIS — M205X1 Other deformities of toe(s) (acquired), right foot: Secondary | ICD-10-CM | POA: Diagnosis not present

## 2024-11-17 DIAGNOSIS — Z90710 Acquired absence of both cervix and uterus: Secondary | ICD-10-CM

## 2024-11-17 DIAGNOSIS — Z8639 Personal history of other endocrine, nutritional and metabolic disease: Secondary | ICD-10-CM | POA: Diagnosis not present

## 2024-11-17 DIAGNOSIS — Z90722 Acquired absence of ovaries, bilateral: Secondary | ICD-10-CM

## 2024-11-17 DIAGNOSIS — E049 Nontoxic goiter, unspecified: Secondary | ICD-10-CM | POA: Diagnosis not present

## 2024-11-17 DIAGNOSIS — M19041 Primary osteoarthritis, right hand: Secondary | ICD-10-CM | POA: Diagnosis not present

## 2024-11-17 DIAGNOSIS — R21 Rash and other nonspecific skin eruption: Secondary | ICD-10-CM

## 2024-11-17 DIAGNOSIS — Z9079 Acquired absence of other genital organ(s): Secondary | ICD-10-CM

## 2024-11-17 DIAGNOSIS — M19042 Primary osteoarthritis, left hand: Secondary | ICD-10-CM

## 2024-11-17 DIAGNOSIS — M533 Sacrococcygeal disorders, not elsewhere classified: Secondary | ICD-10-CM

## 2024-11-17 DIAGNOSIS — G8929 Other chronic pain: Secondary | ICD-10-CM

## 2024-11-17 DIAGNOSIS — Z780 Asymptomatic menopausal state: Secondary | ICD-10-CM

## 2024-11-17 DIAGNOSIS — G4733 Obstructive sleep apnea (adult) (pediatric): Secondary | ICD-10-CM

## 2024-11-17 DIAGNOSIS — G932 Benign intracranial hypertension: Secondary | ICD-10-CM | POA: Diagnosis not present

## 2024-11-17 DIAGNOSIS — E790 Hyperuricemia without signs of inflammatory arthritis and tophaceous disease: Secondary | ICD-10-CM | POA: Diagnosis not present

## 2024-11-17 DIAGNOSIS — Z6841 Body Mass Index (BMI) 40.0 and over, adult: Secondary | ICD-10-CM

## 2024-11-17 DIAGNOSIS — M19071 Primary osteoarthritis, right ankle and foot: Secondary | ICD-10-CM

## 2024-11-17 DIAGNOSIS — M2242 Chondromalacia patellae, left knee: Secondary | ICD-10-CM

## 2024-11-17 DIAGNOSIS — M25511 Pain in right shoulder: Secondary | ICD-10-CM | POA: Diagnosis not present

## 2024-11-17 DIAGNOSIS — M791 Myalgia, unspecified site: Secondary | ICD-10-CM

## 2024-11-17 DIAGNOSIS — Z13820 Encounter for screening for osteoporosis: Secondary | ICD-10-CM

## 2024-11-17 DIAGNOSIS — M19072 Primary osteoarthritis, left ankle and foot: Secondary | ICD-10-CM

## 2024-11-23 ENCOUNTER — Ambulatory Visit: Admitting: Occupational Therapy

## 2024-11-23 DIAGNOSIS — M79641 Pain in right hand: Secondary | ICD-10-CM

## 2024-11-23 DIAGNOSIS — M6281 Muscle weakness (generalized): Secondary | ICD-10-CM

## 2024-11-23 DIAGNOSIS — M25531 Pain in right wrist: Secondary | ICD-10-CM

## 2024-11-23 DIAGNOSIS — M25631 Stiffness of right wrist, not elsewhere classified: Secondary | ICD-10-CM

## 2024-11-23 NOTE — Therapy (Signed)
 " OUTPATIENT OCCUPATIONAL THERAPY ORTHO TREATMENT  Patient Name: Penny Fields MRN: [id number] DOB:[birth year], 54 y.o., female Today's Date: 11/23/2024  PCP: Dr Kerry MART PROVIDER: Marion PA  END OF SESSION:  OT End of Session - 11/23/24 0906     Visit Number 4    Number of Visits 12    Date for Recertification  12/22/24    OT Start Time 0904    OT Stop Time 0950    OT Time Calculation (min) 46 min    Activity Tolerance Patient tolerated treatment well    Behavior During Therapy WFL for tasks assessed/performed          Past Medical History:  Diagnosis Date   Goiter    History of diabetes mellitus    History of hyperlipidemia    History of hypertension    Idiopathic intracranial hypertension    Urticaria    Past Surgical History:  Procedure Laterality Date   ABDOMINAL HYSTERECTOMY     CYST EXCISION     on back   LUMBAR PUNCTURE     MYOMECTOMY     TONSILLECTOMY     20s   Patient Active Problem List   Diagnosis Date Noted   Dermatitis due to food taken internally 04/11/2024   History of hypertension    Hallux limitus of right foot 09/20/2018   Idiopathic intracranial hypertension 12/17/2014   Morbid obesity (HCC) 12/17/2014   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   Essential hypertension 07/04/2013   Hyperlipidemia 07/04/2013   Lactose intolerance 07/04/2013   S/P total abdominal hysterectomy and bilateral salpingo-oophorectomy 07/04/2013   Obstructive sleep apnea 12/17/2012   Diabetes mellitus (HCC) 12/24/2009   Goiter 06/05/1997    ONSET DATE: 07/11/24  REFERRING DIAG: Right distal distal radius fracture  THERAPY DIAG:  Stiffness of right wrist, not elsewhere classified  Muscle weakness (generalized)  Pain in right wrist  Pain in right hand  Rationale for Evaluation and Treatment: Rehabilitation  SUBJECTIVE:   SUBJECTIVE STATEMENT: I can tell him so much better just in this few sessions.  I am more stronger have more flexibility.  I can reach  behind my back now I can most do everything.  I started working with a trainer twice a week.  But we stopped it about 4 pounds weight.  And did not attempt any pushing through my palm. Pt accompanied by: self  PERTINENT HISTORY: 08/24/24 ortho note:  08/08/2024 08/08/2024 Impression: 54 year old female with healing nondisplaced right distal radius fracture and coccyx contusion  Plan:  Discussed diagnosis in detail with the patient today. We reviewed her x-ray results.  Will transition her to a cock up wrist brace. She was instructed to work on gentle range of motion exercises as well.  Regarding her tailbone and recommend the use of a ring cushion which patient has at home. She was also instructed to use over-the-counter pain medication.  Patient will follow-up for repeat evaluation and x-rays of her wrist in 2 weeks or sooner with any questions or concerns.  Patient is in agreement with recommended treatment plan. All questions were answered. [email or web address] Not available 08/08/2024 13:06:18  08/24/2024 08/24/2024 Impression: 53 year old female with healing nondisplaced right distal radius fracture and coccyx contusion  Plan:  Discussed diagnosis in detail with the patient today. We reviewed her x-ray results.  Patient instructed she may discontinue her wrist brace. She was given physical therapy home exercise program to begin.  She may return to activity as tolerated.  She will follow-up for repeat evaluation  in our office in the future as needed or sooner with any questions or concerns.      PRECAUTIONS: None    WEIGHT BEARING RESTRICTIONS: No  PAIN:  Are you having pain?  Denies pain coming in  FALLS: Has patient fallen in last 6 months?  1 time frame pickleball early September  LIVING ENVIRONMENT: Lives with: lives alone   PLOF: Had normal range of motion in right hand.  Prior to fall.  Right-hand-dominant.  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  Likes to play pickle ball or  free time and shopping doing her own housework  PATIENT GOALS: I want to get my flexibility back in my wrist and the strength that I can base myself and, right and pick up half a gallon or a gallon and play pickle ball again  NEXT MD VISIT: Do not have a follow-up  OBJECTIVE:  Note: Objective measures were completed at Evaluation unless otherwise noted.  HAND DOMINANCE: Right  ADLs: I cannot bathe myself with my right hand, perform hygiene, apply deodorant, squeeze a washcloth or pick up a gallon of milk.  Or cut food or signing or writing.  Or reaching behind the back.  Cannot play pickle ball or opening a jar   FUNCTIONAL OUTCOME MEASURES: PRWHE pain  23/50 and function 40/50  UPPER EXTREMITY ROM:     Active ROM Right eval Left eval R 10/30/24 R 11/16/24 R 11/23/24  Shoulder flexion       Shoulder abduction       Shoulder adduction       Shoulder extension       Shoulder internal rotation       Shoulder external rotation       Elbow flexion       Elbow extension       Wrist flexion 45  60 73 85  Wrist extension 35  40 50 55  Wrist ulnar deviation 25   30 30   Wrist radial deviation 18   20 20   Wrist pronation 90   90 90  Wrist supination 90   90 90  (Blank rows = not tested)  Active ROM Right eval Left eval  Thumb MCP (0-60)    Thumb IP (0-80)    Thumb Radial abd/add (0-55)     Thumb Palmar abd/add (0-45)     Thumb Opposition to Small Finger     Index MCP (0-90) Pain with composite flexion , wrist ext     Index PIP (0-100)     Index DIP (0-70)      Long MCP (0-90)      Long PIP (0-100)      Long DIP (0-70)      Ring MCP (0-90)      Ring PIP (0-100)      Ring DIP (0-70)      Little MCP (0-90)      Little PIP (0-100)      Little DIP (0-70)      (Blank rows = not tested)      HAND FUNCTION: Eval Grip strength: Right: 41 lbs; Left: 61 lbs, Lateral pinch: Right: 14 lbs, Left: 18 lbs, and 3 point pinch: Right: 9 lbs, Left: 15 lbs 11/16/24 Grip strength:  Right: 58lbs; Left: 61 lbs, Lateral pinch: Right: 20lbs, Left: 18 lbs, and 3 point pinch: Right: 14 lbs, Left: 15 lbs  COORDINATION: WFL  SENSATION: Denies any sensory issues  EDEMA: R hand around [facility] R 20 cm and L  19 cm ; wrist R 19 cm , L 17.2 cm , forearm same  Wrist decrease to 18 cm and MC to 19.5 cm   COGNITION: Overall cognitive status: Within functional limits for tasks assessed      TREATMENT DATE: 11/23/24      Patient made great progress from start of care.  Mostly limited now in wrist extension active range of motion. Strength in wrist and forearm 5 -/5 except wrist extension lag endrange.                                                                                                          Modalities: Paraffin Time: 8 Location: Right wrist and hand Prolonged flexion and ext stretch    Done soft tissue mobs done to [facility] and carpal spreads  Gentle traction with mobs for CT rows - with stretches for  ext  Was able to increase patient carrying 8 pounds while performing some joint mobilization at the carpal rows by OT. Patient this date could left push overhead 8 pounds simulate pouring a drink.  Symptom-free  Pt to cont with contrast if continues to have increase edema at the wrist otherwise can do moist heat focusing on wrist extension stretch Patient fitted with new Tubigrip D to wear during the day but also at night to decrease edema in the wrist or circumference.  HEP After prolonged stretch for wrist flexion and ext  Add AAROM moving elbow and forearm on wrist - for flexion , ext  on pillow  Review and pt to  cont to focus on UD  And double time for wrist ext   Patient can focus on prayer stretch for wrist extension prior to  table slides 20 reps - 3 x day 2 reps Prior to Wall push-up.  Patient was able to do 2 sets of 10 reps Wall push-up symptom-free with arms above shoulder height.  Even weight. Patient can also do weight shifting on all quads to  facilitate wrist extension with trainer.  Can continue with 2 lbs weight for wrist RD, UD and sup/pro and flexion and ext 15 reps reps need v/c to maintain wrist ext and neutral position  3 sets 1-2 times a day  With trainer forearm weight bearing and can increase to 8 pounds for upper body As well as wall push-ups            PATIENT EDUCATION: Education details: findings of eval and HEP  Person educated: Patient Education method: Explanation, Demonstration, Tactile cues, Verbal cues, and Handouts Education comprehension: verbalized understanding, returned demonstration, verbal cues required, and needs further education   GOALS: Goals reviewed with patient? Yes  SHORT TERM GOALS: Target date: 2 weeks  Patient to be independent in home program to decrease edema now 1 cm in right wrist and increase motion in wrist flexion extension Baseline: No knowledge of home program.  Wrist increase by 1.8 cm.  Patient with pain 4/10 over dorsal hand to the wrist.  Wrist flexion 45 degrees and extension 35 degrees Goal status: INITIAL  LONG TERM GOALS: Target date: 8 wks  Patient's right wrist flexion extension increased to within normal range for patient to dry her back, applied deodorant and use her right hand for bathing Baseline: Patient unable to reach for her back, use left hand for bathing.  And applying deodorant.  Wrist flexion 45 degrees and extension 35 Goal status: INITIAL  2.  Patient's pain in right wrist and hand improved on PRWHE more than 15 points Baseline: Patient's PRWHE pain score 23/50 with most of her pain with wrist flexion extension over the dorsal hand into the wrist and forearm.  4/10. Goal status: INITIAL  3.  Right wrist and forearm strength improved to at least 4+/5 for patient to be able to push and pull heavy door and turn doorknobs symptom-free Baseline: Right wrist flexion 45 degrees and extension 35.  With 4/10 pain at times.  Radial deviation 18  degrees ulnar deviation 25.  With strength 4 -/5 in range.  Supination pronation 90 degrees but 4 -/5 strength. Goal status: INITIAL  4.  Patient's right grip and 3-point pinch improved to within normal range for her age for patient to be able to carry a gallon of milk, carry a plate, cutting food and pour a drink symptom-free Baseline: Right grip 41 pounds and left 61 pounds.  3-point pinch right 9 pounds left 15 pounds Goal status: INITIAL  5.  Pt PRWHE function score improved with more than 30 points Baseline:  PRWHE function score at evaluation 40/50 Goal status: INITIAL    ASSESSMENT:  CLINICAL IMPRESSION: Patient seen today for occupational therapy evaluation for right dominant hand distal radius fracture first week of September 2025.  Patient report patient was in a cast and then prefab wrist splint.  Had in the past 4-5 sessions of therapy.  Patient present with decreased wrist flexion extension worse than radial ulnar deviation.  Patient was decreased strength in the wrist and forearm as well as grip and prehension strength.  Patient reports pain 4/10 to 6/10 at the most over the dorsal hand into the wrist and forearm at the end of the day.    NOW -patient continues to make great progress just in a few sessions.  Strength in wrist and forearm 5 -/5.  Still lacking a wrist extension.  Focusing on joint mobilization and facilitation for increased wrist extension with stretches and table slides.  Was able to initiate wall push-ups today.  Patient started working out with a trainer 2 times a week this week.  Patient was able to increase carrying and lifting 8 pounds.  Last visit was 4 pounds.  Continues to focus on increasing wrist extension.  Patient with increased edema at the R wrist.  Patient limited in functional use of right dominant hand in ADLs and IADLs.  Patient can get benefit from skilled OT services to decrease edema and pain and increased motion and strength in right dominant  hand and wrist patient to return to prior level of function.  PERFORMANCE DEFICITS: in functional skills including ADLs, IADLs, ROM, strength, pain, flexibility, decreased knowledge of use of DME, and UE functional use,   and psychosocial skills including environmental adaptation and routines and behaviors.   IMPAIRMENTS: are limiting patient from ADLs, IADLs, rest and sleep, play, leisure, and social participation.   COMORBIDITIES: has no other co-morbidities that affects occupational performance. Patient will benefit from skilled OT to address above impairments and improve overall function.  MODIFICATION OR ASSISTANCE TO COMPLETE EVALUATION: No modification of  tasks or assist necessary to complete an evaluation.  OT OCCUPATIONAL PROFILE AND HISTORY: Problem focused assessment: Including review of records relating to presenting problem.  CLINICAL DECISION MAKING: LOW - limited treatment options, no task modification necessary  REHAB POTENTIAL: Good for goals  EVALUATION COMPLEXITY: Low      PLAN:  OT FREQUENCY: 1-2x/week  OT DURATION: 8 wks  PLANNED INTERVENTIONS: 97168 OT Re-evaluation, 97535 self care/ADL training, 02889 therapeutic exercise, 97530 therapeutic activity, 97112 neuromuscular re-education, 97140 manual therapy, 97018 paraffin, 02960 fluidotherapy, 97034 contrast bath, 97760 Orthotic Initial, [id number] Orthotic/Prosthetic subsequent, passive range of motion, patient/family education, and DME and/or AE instructions    CONSULTED AND AGREED WITH PLAN OF CARE: Patient    Penny Fields, OTR/L,CLT 11/23/2024, 11:04 AM  0 "

## 2024-11-24 ENCOUNTER — Other Ambulatory Visit: Payer: Self-pay

## 2024-11-24 DIAGNOSIS — M533 Sacrococcygeal disorders, not elsewhere classified: Secondary | ICD-10-CM

## 2024-11-30 ENCOUNTER — Ambulatory Visit: Attending: Rheumatology | Admitting: Physical Therapy

## 2024-11-30 ENCOUNTER — Encounter: Payer: Self-pay | Admitting: Physical Therapy

## 2024-11-30 DIAGNOSIS — R2689 Other abnormalities of gait and mobility: Secondary | ICD-10-CM | POA: Diagnosis present

## 2024-11-30 DIAGNOSIS — M533 Sacrococcygeal disorders, not elsewhere classified: Secondary | ICD-10-CM | POA: Insufficient documentation

## 2024-11-30 NOTE — Patient Instructions (Signed)
 Avoid straining pelvic floor, abdominal muscles , spine  Use log rolling technique instead of getting out of bed with your neck or the sit-up  Log rolling into and out of bed Log rolling into and out of bed If getting out of bed on R side, Bent knees, scoot hips/ shoulder to L  Raise R arm completely overhead, rolling onto armpit  Then lower bent knees to bed to get into complete side lying position  Then drop legs off bed, and push up onto R elbow/forearm, and use L hand to push onto the bed __ Proper body mechanics with getting out of a chair to decrease strain  on back &pelvic floor    Avoid holding your breath when Getting out of the chair:  Scoot to front part of chair Feet are hip width apart,  Knees / feet slightly out in a V, Heels behind knees, feet are hip width apart, nose over toes  Inhale like you are smelling roses Exhale to stand , keeping weight across the ballmounds AND heels , navel over ballmounds  not just in the heels and not locking knees with navel shifting back _   Sitting with feet on ground, four points of contact on sitting bones with slight tilt of pelvis forward, not sitting on tailbone  Catch yourself crossing ankles and thighs   __   Getting into and out SUV turning perpendicular to the wheel,  Getting with butt first not swinging one leg in first    __   Lengthen Back rib by L  shoulder ( winging)  Lie on R  side , pillow between knees and under head  Pull  L arm overhead over mattress, grab the edge of mattress,pull it upward, drawing elbow away from ears  Breathing 10 reps Brushing arm with 3/4 turn onto pillow behind back  Lying on R  side ,Pillow/ Block between knees  dragging top forearm across ribs below breast rotating 3/4 turn,  rotating  _L_ only this week ,  relax onto the pillow behind the back  and then back to other palm , maintain top palm on body whole top and not lift shoulder Do this side this week       Wait do both  sides until we have levelled out your spine and shoulders ___

## 2024-11-30 NOTE — Therapy (Addendum)
 " OUTPATIENT PHYSICAL THERAPY EVALUATION   Patient Name: Penny Fields MRN: [id number] DOB:02/15/1971, 54 y.o., female Today's Date: 11/30/2024   PT End of Session - 11/30/24 1111     Visit Number 1    Number of Visits 10    Date for Recertification  02/08/25    PT Start Time 1110    PT Stop Time 1150    PT Time Calculation (min) 40 min    Activity Tolerance Patient tolerated treatment well    Behavior During Therapy WFL for tasks assessed/performed          Past Medical History:  Diagnosis Date   Goiter    History of diabetes mellitus    History of hyperlipidemia    History of hypertension    Idiopathic intracranial hypertension    Urticaria    Past Surgical History:  Procedure Laterality Date   ABDOMINAL HYSTERECTOMY     CYST EXCISION     on back   LUMBAR PUNCTURE     MYOMECTOMY     TONSILLECTOMY     20s   Patient Active Problem List   Diagnosis Date Noted   Dermatitis due to food taken internally 04/11/2024   History of hypertension    Hallux limitus of right foot 09/20/2018   Idiopathic intracranial hypertension 12/17/2014   Morbid obesity (HCC) 12/17/2014   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   Essential hypertension 07/04/2013   Hyperlipidemia 07/04/2013   Lactose intolerance 07/04/2013   S/P total abdominal hysterectomy and bilateral salpingo-oophorectomy 07/04/2013   Obstructive sleep apnea 12/17/2012   Diabetes mellitus (HCC) 12/24/2009   Goiter 06/05/1997    PCP:  [facility]   REFERRING PROVIDER: Dolphus   REFERRING DIAG: Coccygeal pain   Rationale for Evaluation and Treatment Rehabilitation  THERAPY DIAG:  Sacrococcygeal disorders, not elsewhere classified  Other abnormalities of gait and mobility  ONSET DATE: 07/09/24  SUBJECTIVE:                                                                                                                                                                                           SUBJECTIVE STATEMENT ON EVAL  11/30/24: 1) tailbone pain started after a fall onto her tailbone while playing pickleball and she tripped on 07/09/24. Pt only has had OT for her hand but not for her tailbone.  Currently hurts tailbone to go from sit to stand everytime 9-10/10  Pain is at the tailbone and not radiating. Pt relieves the pain after work with lying on her side. No pain with sleeping on her back / side  No changes to bowel and bladder  No pain with stairs  2) R great toe pain that occurs every now and then. Pt has changes type of shoe  and has to have a soft bottom shoe which relieves the pain .  Pain started years ago and gone to see podiatrist.    Pt is started working with a fitness trainer this past week because she wants to build up her strength   Pt has been chiropractor after her fall every week for 2 months and now it is for every 3 weeks. It helped her not feel as tight.   Pt has felt her wrist pain from the fall injury has improved by 70% .  Pt has not returned to pickle ball and OT asked her to wait till the spring.   PERTINENT HISTORY:  3 incisions over abdominal hysterectomy   PAIN:  Are you having pain? Yes: see above   PRECAUTIONS: No   WEIGHT BEARING RESTRICTIONS: No   FALLS:  Has patient fallen in last 6 months? 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   PLOF: IND   PATIENT GOALS:  Get up from the floor No pain with getting up from sitting       OBJECTIVE:    [facility] PT Assessment - 11/30/24 1119       Posture/Postural Control   Posture Comments hyperextended knees      Strength   Overall Strength Comments BLE 4/5      Palpation   Spinal mobility R shoulder, R iliac crest lowered    SI assessment  tightness along L paraspinals, T/L junction hypomobile      Ambulation/Gait   Gait Comments 1.44 m/s, decreased  stance on R, limited anterior rotation of L , L toe in,            Self-Care   Self-Care Other Self-Care Comments    Other Self-Care Comments  explained regional interdependent approach to address pain at different body parts, explained upcoming visits to realign posture and spine.pelvis to improve Sx and achieve goals, showed anatomy images of deep core system ,      Therapeutic Activites    Therapeutic Activities Other Therapeutic Activities    Other Therapeutic Activities inquired about meaningful tasks and role of learning proper body mechanics, future sessions to help realign pelvis and spine and to learn co-activation of deep core system when performing against gravity tasks . These improvements will help address pain and Sx     Neuro Re-ed    Neuro Re-ed Details  cued for alignment and proprioception of pelvis and LKC and spine in  sit to stand and for proper sitting posture to activate deep core system         [facility] Adult PT Treatment/Exercise - 12/01/24 1626       Manual Therapy   Manual therapy comments STM/MWM at T/L junction which helped to level shoulders and spine              HOME EXERCISE PROGRAM: See pt instruction section    ASSESSMENT:  CLINICAL IMPRESSION: Pt is a 54  yo  who presents with  following issues which impact QOL, ADL,  fitness, social and community activities:    1) tailbone pain 2) R great toe pain    Pt's musculoskeletal assessment revealed uneven pelvic girdle and shoulder height, asymmetries to gait pattern, limited  spinal /pelvic mobility, poor body mechanics which places strain on the abdominal/pelvic floor mm. These are deficits that indicate an ineffective intraabdominal pressure system associated with increased risk for pt's Sx.  Pt with abdominal hysterectomy with 3 incisions which will be assessed as it may have fascial restrictions around pelvic girdle and ROM.      Pt will benefit from propioception/ coordination/ body  mechanics training and education with gravity-loaded tasks at work and home and  fitness modifications in order to gain a more effective intraabdominal pressure system to minimize Sx. Advised pt to not perform sit-ups and crunches as these movement patterns lead to more downward forces on the pelvic floor, negatively impacting abdominopelvic/spinal dysfunctions.   Pt was provided education on etiology of Sx with anatomy, physiology explanation with images along with the benefits of customized pelvic PT Tx based on pt's medical conditions and musculoskeletal deficits.  Explained the physiology of deep core mm coordination and roles of pelvic floor function/  lower kinetic chain ( LKC) , spinal/pelvic alignment for urination, defecation, sexual function, and postural control with deep core mm system, and the role of posture and alignment to help issues.    Following Tx today which pt tolerated without complaints,  pt demo'd proper body mechanics to minimize straining pelvic floor and showed more levelled pelvis and shoulders. Pt reported no tailbone pain with proper sitting mechanics post Tx.  Plan to address realignment of spine/ pelvis at next session to help promote optimize intra-abdominal pressure system (IAP)  for improved pelvic floor function, trunk stability, gait, balance, stabilization with mobility tasks.  Plan to address R great toe issues once pelvis and spine are realigned to yield better outcomes.    Regional interdependent approaches will yield greater benefits in pt's POC                                                      Pt benefits from skilled PT.    OBJECTIVE IMPAIRMENTS decreased activity tolerance, decreased coordination, decreased endurance, decreased mobility, difficulty walking, decreased ROM, decreased strength, decreased safety awareness, hypomobility, increased muscle spasms, impaired flexibility, improper body mechanics, postural dysfunction, and pain. scar  restrictions   ACTIVITY LIMITATIONS  self-care,   home chores, work tasks    PARTICIPATION LIMITATIONS:  community, gym activities , social    PERSONAL FACTORS   affecting patient's functional outcome:    REHAB POTENTIAL: Good   CLINICAL DECISION MAKING: Evolving/moderate complexity   EVALUATION COMPLEXITY: Moderate    PATIENT EDUCATION:    Education details: Showed pt anatomy images. Explained muscles attachments/ connection, physiology of deep core system/ spinal- thoracic-pelvis-lower kinetic chain as they relate to pt's presentation, Sx, and past Hx. Explained what and how these areas of deficits need to be restored to balance and function    See Therapeutic activity / neuromuscular re-education section  Answered pt's questions.   Person educated: Patient Education method: Explanation, Demonstration, Tactile cues, Verbal cues, and Handouts Education comprehension: verbalized understanding, returned demonstration, verbal cues required, tactile cues required, and needs further education     PLAN: PT FREQUENCY: 1x/week   PT DURATION: 10 weeks   PLANNED INTERVENTIONS:   Gait training;Stair training;Functional mobility training;DME Instruction;Therapeutic activities;Therapeutic exercise;Balance training;Neuromuscular re-education;Patient/family education;Vestibular;Visual/perceptual remediation/compensation;Passive range of motion;Moist Heat;Cryotherapy;Traction;Canalith Repostioning;Joint Manipulations;Manual lymph drainage;Manual techniques;Scar mobilization;Energy conservation;Dry needling;ADLs/Self Care Home Management;Biofeedback;Electrical Stimulation;Taping  PLAN FOR NEXT SESSION: See clinical impression for plan     GOALS: Goals reviewed with patient? Yes  SHORT TERM GOALS: Target date: 12/28/2024    Pt will demo IND with HEP                    Baseline: Not IND            Goal status: INITIAL   LONG TERM GOALS: Target date: 02/08/2025    1.Pt will demo  proper deep core coordination without chest breathing and optimal excursion of diaphragm/pelvic floor in order to promote spinal stability and pelvic floor function  Baseline: dyscoordination Goal status: INITIAL  2.  Pt will demo proper body mechanics in against gravity tasks and ADLs  work tasks, fitness  to minimize straining pelvic floor / back    Baseline: not IND, improper form that places strain on pelvic floor  Goal status: INITIAL    3. Pt will demo increased gait speed > 15  m/s with reciprocal gait pattern, longer stride length  in order to ambulate safely in community and return to fitness routine  Baseline: 1.44 m/s, decreased stance on R, limited anterior rotation of L , L toe in,  Goal status: INITIAL    4. Pt will demo levelled pelvic girdle and shoulder height in order to progress to deep core strengthening HEP and restore mobility at spine, pelvis, gait, posture minimize falls, and improve balance  Baseline: R shoulder, R iliac crest lowered  Goal status: INITIAL   5. Pt will improve PSFS questionnaire to  pts  score change  to demo improved QOL  Baseline:    9/10  pts  ( total)   Activity 1:  no activity, can not play pickleball until spring - 0/10pts  Activity 2:  Personal training    Activity 3: some walking   Goal status: INITIAL  6. Pt will demo proper body ,mechanics for transferring to and from the floor to be able to return to fitness and be IND if she has a fall to get back up  Baseline: pt feels she does not have strength in her lower body to get down and from the floor   Goal status: INITIAL  7. Pt will report no more R great toe pain and tailbone pain with sit to stand across 2 weeks in order to return ADLs and fitness  Baseline: R great toe pain that occurs every now and then.  Currently hurts tailbone to go from sit to stand everytime 9-10/10  Goal status: INITIAL  8. Pt will demo improved abdominal scar mobility to improve ROM for fitness  positions and weight training and improve sit to stand, squats, hip hinge  Baseline:abdominal hysterectomy with 3 incisions which will be assessed as it may have fascial restrictions around pelvic girdle and ROM.  Goal status: INITIAL  Pia Lupe Plump, PT 11/30/2024, 11:31 AM  "

## 2024-12-01 ENCOUNTER — Ambulatory Visit: Admitting: Occupational Therapy

## 2024-12-01 DIAGNOSIS — M79641 Pain in right hand: Secondary | ICD-10-CM

## 2024-12-01 DIAGNOSIS — M6281 Muscle weakness (generalized): Secondary | ICD-10-CM

## 2024-12-01 DIAGNOSIS — M25631 Stiffness of right wrist, not elsewhere classified: Secondary | ICD-10-CM

## 2024-12-01 DIAGNOSIS — M25531 Pain in right wrist: Secondary | ICD-10-CM

## 2024-12-01 NOTE — Therapy (Signed)
 " OUTPATIENT OCCUPATIONAL THERAPY ORTHO TREATMENT  Patient Name: Penny Fields MRN: [id number] DOB:16-Sep-1971, 54 y.o., female Today's Date: 12/01/2024  PCP: Dr Kerry MART PROVIDER: Marion PA  END OF SESSION:  OT End of Session - 12/01/24 0917     Visit Number 5    Number of Visits 12    Date for Recertification  12/22/24    OT Start Time 0914    OT Stop Time 1000    OT Time Calculation (min) 46 min    Activity Tolerance Patient tolerated treatment well    Behavior During Therapy WFL for tasks assessed/performed          Past Medical History:  Diagnosis Date   Goiter    History of diabetes mellitus    History of hyperlipidemia    History of hypertension    Idiopathic intracranial hypertension    Urticaria    Past Surgical History:  Procedure Laterality Date   ABDOMINAL HYSTERECTOMY     CYST EXCISION     on back   LUMBAR PUNCTURE     MYOMECTOMY     TONSILLECTOMY     20s   Patient Active Problem List   Diagnosis Date Noted   Dermatitis due to food taken internally 04/11/2024   History of hypertension    Hallux limitus of right foot 09/20/2018   Idiopathic intracranial hypertension 12/17/2014   Morbid obesity (HCC) 12/17/2014   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   Essential hypertension 07/04/2013   Hyperlipidemia 07/04/2013   Lactose intolerance 07/04/2013   S/P total abdominal hysterectomy and bilateral salpingo-oophorectomy 07/04/2013   Obstructive sleep apnea 12/17/2012   Diabetes mellitus (HCC) 12/24/2009   Goiter 06/05/1997    ONSET DATE: 07/11/24  REFERRING DIAG: Right distal distal radius fracture  THERAPY DIAG:  Stiffness of right wrist, not elsewhere classified  Muscle weakness (generalized)  Pain in right wrist  Pain in right hand  Rationale for Evaluation and Treatment: Rehabilitation  SUBJECTIVE:   SUBJECTIVE STATEMENT: I can tell my motion and strength is better.  I think I just overused it at work yesterday prepping for this  weather.  And I had a little soreness.  And then the flexibility  in wrist bending back - but was able to do wall pushups  Pt accompanied by: self  PERTINENT HISTORY: 08/24/24 ortho note:  08/08/2024 08/08/2024 Impression: 54 year old female with healing nondisplaced right distal radius fracture and coccyx contusion  Plan:  Discussed diagnosis in detail with the patient today. We reviewed her x-ray results.  Will transition her to a cock up wrist brace. She was instructed to work on gentle range of motion exercises as well.  Regarding her tailbone and recommend the use of a ring cushion which patient has at home. She was also instructed to use over-the-counter pain medication.  Patient will follow-up for repeat evaluation and x-rays of her wrist in 2 weeks or sooner with any questions or concerns.  Patient is in agreement with recommended treatment plan. All questions were answered. [email or web address] Not available 08/08/2024 13:06:18  08/24/2024 08/24/2024 Impression: 54 year old female with healing nondisplaced right distal radius fracture and coccyx contusion  Plan:  Discussed diagnosis in detail with the patient today. We reviewed her x-ray results.  Patient instructed she may discontinue her wrist brace. She was given physical therapy home exercise program to begin.  She may return to activity as tolerated.  She will follow-up for repeat evaluation in our office in the future as needed or sooner with any questions  or concerns.      PRECAUTIONS: None    WEIGHT BEARING RESTRICTIONS: No  PAIN:  Are you having pain?  Denies pain coming in  FALLS: Has patient fallen in last 6 months?  1 time frame pickleball early September  LIVING ENVIRONMENT: Lives with: lives alone   PLOF: Had normal range of motion in right hand.  Prior to fall.  Right-hand-dominant.  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  Likes to play pickle ball or free time and shopping doing her own housework  PATIENT GOALS:  I want to get my flexibility back in my wrist and the strength that I can base myself and, right and pick up half a gallon or a gallon and play pickle ball again  NEXT MD VISIT: Do not have a follow-up  OBJECTIVE:  Note: Objective measures were completed at Evaluation unless otherwise noted.  HAND DOMINANCE: Right  ADLs: I cannot bathe myself with my right hand, perform hygiene, apply deodorant, squeeze a washcloth or pick up a gallon of milk.  Or cut food or signing or writing.  Or reaching behind the back.  Cannot play pickle ball or opening a jar   FUNCTIONAL OUTCOME MEASURES: PRWHE pain  23/50 and function 40/50  UPPER EXTREMITY ROM:     Active ROM Right eval Left eval R 10/30/24 R 11/16/24 R 11/23/24 R 12/01/24  Shoulder flexion        Shoulder abduction        Shoulder adduction        Shoulder extension        Shoulder internal rotation        Shoulder external rotation        Elbow flexion        Elbow extension        Wrist flexion 45  60 73 85 75  Wrist extension 35  40 50 55 60  Wrist ulnar deviation 25   30 30 30   Wrist radial deviation 18   20 20 20   Wrist pronation 90   90 90 90  Wrist supination 90   90 90 90  (Blank rows = not tested)  Active ROM Right eval Left eval  Thumb MCP (0-60)    Thumb IP (0-80)    Thumb Radial abd/add (0-55)     Thumb Palmar abd/add (0-45)     Thumb Opposition to Small Finger     Index MCP (0-90) Pain with composite flexion , wrist ext     Index PIP (0-100)     Index DIP (0-70)      Long MCP (0-90)      Long PIP (0-100)      Long DIP (0-70)      Ring MCP (0-90)      Ring PIP (0-100)      Ring DIP (0-70)      Little MCP (0-90)      Little PIP (0-100)      Little DIP (0-70)      (Blank rows = not tested)      HAND FUNCTION: Eval Grip strength: Right: 41 lbs; Left: 61 lbs, Lateral pinch: Right: 14 lbs, Left: 18 lbs, and 3 point pinch: Right: 9 lbs, Left: 15 lbs 11/16/24 Grip strength: Right: 58lbs; Left: 61 lbs,  Lateral pinch: Right: 20lbs, Left: 18 lbs, and 3 point pinch: Right: 14 lbs, Left: 15 lbs 12/01/24 Grip strength: Right: 64 lbs; Left: 61 lbs, Lateral pinch: Right: 20lbs, Left: 18 lbs, and  3 point pinch: Right: 15 lbs, Left: 15 lbs COORDINATION: WFL  SENSATION: Denies any sensory issues  EDEMA: R hand around [facility] R 20 cm and L 19 cm ; wrist R 19 cm , L 17.2 cm , forearm same  Wrist decrease to 18 cm and MC to 19.5 cm   COGNITION: Overall cognitive status: Within functional limits for tasks assessed      TREATMENT DATE: 12/01/24      Patient made great progress in 5 sessions from start of care.  Motion limited in endrange composite flexion and extension of the wrist.   But continues to make progress.  Reinforced with patient to continue with those stretches daily.  Patient even throughout the day.   Strength  in wrist and forearm in AROM plain 5/5                                                                                                      Was able to increase patient carrying 10 pounds symptom-free as well as simulate pouring a drink. Patient this date could do left upper extremity push overhead 8 pounds   Symptom-free  Patient had questions on home exercises or working out with trainer. Recommend for patient she can do 8 pounds for rowing Triceps Biceps Scapular retraction with shoulder horizontal abduction patient on 2 pounds better with limited and external rotation because of shoulder injury in the past Reviewed with patient external rotation stretch on table 10 reps Prior to shoulder horizontal abduction with 2 pounds against the wall done better Patient to perform these once a day   HEP After prolonged stretch for wrist flexion and ext  AAROM moving elbow and forearm on wrist - for flexion , ext  on pillow  Patient can focus on prayer stretch for wrist extension prior to  table slides 20 reps - 3 x day 2 reps Prior to Wall push-up.   Patient was able to do 2  sets of 10 reps Wall push-up symptom-free with arms above shoulder height.  Even weight.   Can continue with 2 lbs weight for wrist RD, UD and sup/pro and flexion and ext 15 reps reps need v/c to maintain wrist ext and neutral position  3 sets 1-2 times a day  Patient did see pelvic health physical therapist yesterday. Patient very happy and incorporating some of information provided and education             PATIENT EDUCATION: Education details: findings of eval and HEP  Person educated: Patient Education method: Explanation, Demonstration, Tactile cues, Verbal cues, and Handouts Education comprehension: verbalized understanding, returned demonstration, verbal cues required, and needs further education   GOALS: Goals reviewed with patient? Yes  SHORT TERM GOALS: Target date: 2 weeks  Patient to be independent in home program to decrease edema now 1 cm in right wrist and increase motion in wrist flexion extension Baseline: No knowledge of home program.  Wrist increase by 1.8 cm.  Patient with pain 4/10 over dorsal hand to the wrist.  Wrist flexion 45 degrees and extension 35 degrees  Goal status: INITIAL  LONG TERM GOALS: Target date: 8 wks  Patient's right wrist flexion extension increased to within normal range for patient to dry her back, applied deodorant and use her right hand for bathing Baseline: Patient unable to reach for her back, use left hand for bathing.  And applying deodorant.  Wrist flexion 45 degrees and extension 35 Goal status: INITIAL  2.  Patient's pain in right wrist and hand improved on PRWHE more than 15 points Baseline: Patient's PRWHE pain score 23/50 with most of her pain with wrist flexion extension over the dorsal hand into the wrist and forearm.  4/10. Goal status: INITIAL  3.  Right wrist and forearm strength improved to at least 4+/5 for patient to be able to push and pull heavy door and turn doorknobs symptom-free Baseline: Right wrist  flexion 45 degrees and extension 35.  With 4/10 pain at times.  Radial deviation 18 degrees ulnar deviation 25.  With strength 4 -/5 in range.  Supination pronation 90 degrees but 4 -/5 strength. Goal status: INITIAL  4.  Patient's right grip and 3-point pinch improved to within normal range for her age for patient to be able to carry a gallon of milk, carry a plate, cutting food and pour a drink symptom-free Baseline: Right grip 41 pounds and left 61 pounds.  3-point pinch right 9 pounds left 15 pounds Goal status: INITIAL  5.  Pt PRWHE function score improved with more than 30 points Baseline:  PRWHE function score at evaluation 40/50 Goal status: INITIAL    ASSESSMENT:  CLINICAL IMPRESSION: Patient seen for occupational therapy for right dominant hand distal radius fracture first week of September 2025.  Patient report patient was in a cast and then prefab wrist splint.  Patient present with decreased wrist flexion extension worse than radial ulnar deviation.  Patient was decreased strength in the wrist and forearm as well as grip and prehension strength.  Patient reports pain 4/10 to 6/10 at the most over the dorsal hand into the wrist and forearm at the end of the day.    NOW -patient made great progress in 5 sessions -strength in her active range of motion wrist and forearm 5 /5.  Still lacking endrange composite wrist extension and flexion.  Patient to continue with composite flexion extension stretches during the day.  Patient tolerating wall push-ups.  Patient also can carry 10 pounds to simulate pouring a drink with no increase symptoms.  Can push overhead 8 pounds.  But would not recommend working out but stay with 8 to 10 pounds below shoulder and above shoulder 2 to 5 pounds.  Patient is going to hold off on working out with a trainer.  Patient was able to see the pelvic health physical therapist yesterday.  Patient to follow-up in 2 weeks again.  Patient limited in functional use of  right dominant hand in ADLs and IADLs.  Patient can get benefit from skilled OT services to decrease edema and pain and increased motion and strength in right dominant hand and wrist patient to return to prior level of function.  PERFORMANCE DEFICITS: in functional skills including ADLs, IADLs, ROM, strength, pain, flexibility, decreased knowledge of use of DME, and UE functional use,   and psychosocial skills including environmental adaptation and routines and behaviors.   IMPAIRMENTS: are limiting patient from ADLs, IADLs, rest and sleep, play, leisure, and social participation.   COMORBIDITIES: has no other co-morbidities that affects occupational performance. Patient will benefit from skilled  OT to address above impairments and improve overall function.  MODIFICATION OR ASSISTANCE TO COMPLETE EVALUATION: No modification of tasks or assist necessary to complete an evaluation.  OT OCCUPATIONAL PROFILE AND HISTORY: Problem focused assessment: Including review of records relating to presenting problem.  CLINICAL DECISION MAKING: LOW - limited treatment options, no task modification necessary  REHAB POTENTIAL: Good for goals  EVALUATION COMPLEXITY: Low      PLAN:  OT FREQUENCY: 1-2x/week  OT DURATION: 8 wks  PLANNED INTERVENTIONS: 97168 OT Re-evaluation, 97535 self care/ADL training, 02889 therapeutic exercise, 97530 therapeutic activity, 97112 neuromuscular re-education, 97140 manual therapy, 97018 paraffin, 02960 fluidotherapy, 97034 contrast bath, 97760 Orthotic Initial, [id number] Orthotic/Prosthetic subsequent, passive range of motion, patient/family education, and DME and/or AE instructions    CONSULTED AND AGREED WITH PLAN OF CARE: Patient    Ancel Peters, OTR/L,CLT 12/01/2024, 10:24 AM  0 "

## 2024-12-05 ENCOUNTER — Ambulatory Visit: Admitting: Physical Therapy

## 2024-12-05 ENCOUNTER — Other Ambulatory Visit: Payer: Self-pay

## 2024-12-07 ENCOUNTER — Ambulatory Visit: Payer: Self-pay | Admitting: Physician Assistant

## 2024-12-07 ENCOUNTER — Ambulatory Visit
Admission: RE | Admit: 2024-12-07 | Discharge: 2024-12-07 | Disposition: A | Discharge status: Home / Self Care | Source: Ambulatory Visit | Attending: Rheumatology | Admitting: Rheumatology

## 2024-12-07 DIAGNOSIS — Z13820 Encounter for screening for osteoporosis: Secondary | ICD-10-CM | POA: Insufficient documentation

## 2024-12-07 DIAGNOSIS — Z780 Asymptomatic menopausal state: Secondary | ICD-10-CM | POA: Insufficient documentation

## 2024-12-07 NOTE — Addendum Note (Signed)
 Addended by: PIA FISCAL on: 12/07/2024 12:56 PM   Modules accepted: Orders

## 2024-12-11 ENCOUNTER — Ambulatory Visit

## 2024-12-14 ENCOUNTER — Ambulatory Visit: Admitting: Occupational Therapy

## 2024-12-14 DIAGNOSIS — M25531 Pain in right wrist: Secondary | ICD-10-CM

## 2024-12-14 DIAGNOSIS — M6281 Muscle weakness (generalized): Secondary | ICD-10-CM

## 2024-12-14 DIAGNOSIS — M79641 Pain in right hand: Secondary | ICD-10-CM

## 2024-12-14 DIAGNOSIS — M25631 Stiffness of right wrist, not elsewhere classified: Secondary | ICD-10-CM

## 2024-12-14 NOTE — Therapy (Signed)
 " OUTPATIENT OCCUPATIONAL THERAPY ORTHO TREATMENT  Patient Name: Penny Fields MRN: [id number] DOB:[birth year], 54 y.o., female Today's Date: 12/14/2024  PCP: Dr Kerry MART PROVIDER: Marion PA  END OF SESSION:  OT End of Session - 12/14/24 0847     Visit Number 6    Number of Visits 12    Date for Recertification  12/22/24    OT Start Time 0850    OT Stop Time 0930    OT Time Calculation (min) 40 min    Activity Tolerance Patient tolerated treatment well    Behavior During Therapy WFL for tasks assessed/performed          Past Medical History:  Diagnosis Date   Goiter    History of diabetes mellitus    History of hyperlipidemia    History of hypertension    Idiopathic intracranial hypertension    Urticaria    Past Surgical History:  Procedure Laterality Date   ABDOMINAL HYSTERECTOMY     CYST EXCISION     on back   LUMBAR PUNCTURE     MYOMECTOMY     TONSILLECTOMY     20s   Patient Active Problem List   Diagnosis Date Noted   Dermatitis due to food taken internally 04/11/2024   History of hypertension    Hallux limitus of right foot 09/20/2018   Idiopathic intracranial hypertension 12/17/2014   Morbid obesity (HCC) 12/17/2014   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   Essential hypertension 07/04/2013   Hyperlipidemia 07/04/2013   Lactose intolerance 07/04/2013   S/P total abdominal hysterectomy and bilateral salpingo-oophorectomy 07/04/2013   Obstructive sleep apnea 12/17/2012   Diabetes mellitus (HCC) 12/24/2009   Goiter 06/05/1997    ONSET DATE: 07/11/24  REFERRING DIAG: Right distal distal radius fracture  THERAPY DIAG:  Stiffness of right wrist, not elsewhere classified  Muscle weakness (generalized)  Pain in right wrist  Pain in right hand  Rationale for Evaluation and Treatment: Rehabilitation  SUBJECTIVE:   SUBJECTIVE STATEMENT: I just struggle to bend my wrist back - but otherwise doing most everything - did not play pickleball yet - and going  on my cruise next week  Pt accompanied by: self  PERTINENT HISTORY: 08/24/24 ortho note:  08/08/2024 08/08/2024 Impression: 54 year old female with healing nondisplaced right distal radius fracture and coccyx contusion  Plan:  Discussed diagnosis in detail with the patient today. We reviewed her x-ray results.  Will transition her to a cock up wrist brace. She was instructed to work on gentle range of motion exercises as well.  Regarding her tailbone and recommend the use of a ring cushion which patient has at home. She was also instructed to use over-the-counter pain medication.  Patient will follow-up for repeat evaluation and x-rays of her wrist in 2 weeks or sooner with any questions or concerns.  Patient is in agreement with recommended treatment plan. All questions were answered. [email or web address] Not available 08/08/2024 13:06:18  08/24/2024 08/24/2024 Impression: 54 year old female with healing nondisplaced right distal radius fracture and coccyx contusion  Plan:  Discussed diagnosis in detail with the patient today. We reviewed her x-ray results.  Patient instructed she may discontinue her wrist brace. She was given physical therapy home exercise program to begin.  She may return to activity as tolerated.  She will follow-up for repeat evaluation in our office in the future as needed or sooner with any questions or concerns.      PRECAUTIONS: None    WEIGHT BEARING RESTRICTIONS: No  PAIN:  Are  you having pain?  Denies pain coming in  FALLS: Has patient fallen in last 6 months?  1 time frame pickleball early September  LIVING ENVIRONMENT: Lives with: lives alone   PLOF: Had normal range of motion in right hand.  Prior to fall.  Right-hand-dominant.  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  Likes to play pickle ball or free time and shopping doing her own housework  PATIENT GOALS: I want to get my flexibility back in my wrist and the strength that I can base myself and, right  and pick up half a gallon or a gallon and play pickle ball again  NEXT MD VISIT: Do not have a follow-up  OBJECTIVE:  Note: Objective measures were completed at Evaluation unless otherwise noted.  HAND DOMINANCE: Right  ADLs: I cannot bathe myself with my right hand, perform hygiene, apply deodorant, squeeze a washcloth or pick up a gallon of milk.  Or cut food or signing or writing.  Or reaching behind the back.  Cannot play pickle ball or opening a jar   FUNCTIONAL OUTCOME MEASURES: Eval  PRWHE pain  23/50 and function 40/50 12/14/24 PRWHE pain 5/50 function 4.5/50  UPPER EXTREMITY ROM:     Active ROM Right eval Left eval R 10/30/24 R 11/16/24 R 11/23/24 R 12/01/24 R 12/14/24  Shoulder flexion         Shoulder abduction         Shoulder adduction         Shoulder extension         Shoulder internal rotation         Shoulder external rotation         Elbow flexion         Elbow extension         Wrist flexion 45  60 73 85 75 80  Wrist extension 35  40 50 55 60 65  Wrist ulnar deviation 25   30 30 30 30   Wrist radial deviation 18   20 20 20 20   Wrist pronation 90   90 90 90 90  Wrist supination 90   90 90 90 90  (Blank rows = not tested)  Active ROM Right eval Left eval  Thumb MCP (0-60)    Thumb IP (0-80)    Thumb Radial abd/add (0-55)     Thumb Palmar abd/add (0-45)     Thumb Opposition to Small Finger     Index MCP (0-90) Pain with composite flexion , wrist ext     Index PIP (0-100)     Index DIP (0-70)      Long MCP (0-90)      Long PIP (0-100)      Long DIP (0-70)      Ring MCP (0-90)      Ring PIP (0-100)      Ring DIP (0-70)      Little MCP (0-90)      Little PIP (0-100)      Little DIP (0-70)      (Blank rows = not tested)      HAND FUNCTION: Eval Grip strength: Right: 41 lbs; Left: 61 lbs, Lateral pinch: Right: 14 lbs, Left: 18 lbs, and 3 point pinch: Right: 9 lbs, Left: 15 lbs 11/16/24 Grip strength: Right: 58lbs; Left: 61 lbs, Lateral pinch:  Right: 20lbs, Left: 18 lbs, and 3 point pinch: Right: 14 lbs, Left: 15 lbs 12/01/24 Grip strength: Right: 64 lbs; Left: 61 lbs, Lateral pinch: Right: 20lbs,  Left: 18 lbs, and 3 point pinch: Right: 15 lbs, Left: 15 lbs 12/14/24 Grip strength: Right: 66 lbs; Left: 61 lbs, Lateral pinch: Right: 21lbs, Left: 18 lbs, and 3 point pinch: Right: 16 lbs, Left: 15 lb COORDINATION: WFL  SENSATION: Denies any sensory issues  EDEMA: R hand around [facility] R 20 cm and L 19 cm ; wrist R 19 cm , L 17.2 cm , forearm same  Wrist decrease to 18 cm and MC to 19.5 cm   COGNITION: Overall cognitive status: Within functional limits for tasks assessed      TREATMENT DATE: 12/14/24      Patient made great progress in 6 sessions from start of care.  Motion limited in endrange composite flexion and extension of the wrist still.    Reinforced with patient to continue with those stretches daily.  Even throughout the day.   Strength  in wrist and forearm in all planes  5/5  Grip and prehension cont to increase - see flowsheet   PRWHE pain and function improved greatly see above score  Paraffin done 8 minutes with right composite digit flexion and wrist flexion stretch 2 x 2 minutes with extension between  Prior to review with patient composite flexion stretch to be done at home after moist heat As well as prayer stretch for wrist extension in between her daily task at work Followed by table slides for passive stretch with weightbearing Or pain-free  Patient was able to do wall push-ups and 1 hand push-up with less discomfort after the paraffin and passive range of motion stretches Patient can hold off on strengthening continue to increase functional task.  And focus mostly on composite wrist flexion extension stretches while on vacation  Done with patient 1 kg ball throw with the right hand can catch bilaterally on the rebounder 2 minutes no symptoms Bilateral hands catch and throw 2 kg ball on rebounder 2 minutes  no increase symptoms Done with patient and her pickleball with a 2 pound weight 5 minutes no increase symptoms After patient to home program while she is on vacation  Recommend for patient to talk with physical therapist pelvic health about exercises for movements and pickleball that she can do during vacation.                                                                                                     PATIENT EDUCATION: Education details: findings of eval and HEP  Person educated: Patient Education method: Explanation, Demonstration, Tactile cues, Verbal cues, and Handouts Education comprehension: verbalized understanding, returned demonstration, verbal cues required, and needs further education   GOALS: Goals reviewed with patient? Yes  SHORT TERM GOALS: Target date: 2 weeks  Patient to be independent in home program to decrease edema now 1 cm in right wrist and increase motion in wrist flexion extension Baseline: No knowledge of home program.  Wrist increase by 1.8 cm.  Patient with pain 4/10 over dorsal hand to the wrist.  Wrist flexion 45 degrees and extension 35 degrees Goal status: Met  LONG TERM GOALS: Target date: 8  wks  Patient's right wrist flexion extension increased to within normal range for patient to dry her back, applied deodorant and use her right hand for bathing Baseline: Patient unable to reach for her back, use left hand for bathing.  And applying deodorant.  Wrist flexion 45 degrees and extension 35 Goal status: Progressing  2.  Patient's pain in right wrist and hand improved on PRWHE more than 15 points Baseline: Patient's PRWHE pain score 23/50 with most of her pain with wrist flexion extension over the dorsal hand into the wrist and forearm.  4/10. Goal status: Met  3.  Right wrist and forearm strength improved to at least 4+/5 for patient to be able to push and pull heavy door and turn doorknobs symptom-free Baseline: Right wrist flexion 45 degrees  and extension 35.  With 4/10 pain at times.  Radial deviation 18 degrees ulnar deviation 25.  With strength 4 -/5 in range.  Supination pronation 90 degrees but 4 -/5 strength. Goal status: Met  4.  Patient's right grip and 3-point pinch improved to within normal range for her age for patient to be able to carry a gallon of milk, carry a plate, cutting food and pour a drink symptom-free Baseline: Right grip 41 pounds and left 61 pounds.  3-point pinch right 9 pounds left 15 pounds Goal status: Met  5.  Pt PRWHE function score improved with more than 30 points Baseline:  PRWHE function score at evaluation 40/50 Goal status: Met    ASSESSMENT:  CLINICAL IMPRESSION: Patient seen for occupational therapy for right dominant hand distal radius fracture first week of September 2025.  Patient report patient was in a cast and then prefab wrist splint.  Patient present with decreased wrist flexion extension worse than radial ulnar deviation.  Patient was decreased strength in the wrist and forearm as well as grip and prehension strength.  Patient reports pain 4/10 to 6/10 at the most over the dorsal hand into the wrist and forearm at the end of the day.      NOW -patient made great progress in 6 sessions -strength in her active range of motion wrist and forearm 5 /5.  Still lacking endrange composite wrist extension  and flexion.  Reviewed with patient a reminder to use heat prior to composite wrist flexion extension stretches during the day.  Patient tolerating wall push-ups and 1 hand push-up symptom-free after stretches..  Patient also can carry 10 pounds to simulate pouring a drink with no increase symptoms.  Grip and prehension strength continues to increase see flowsheet.  Simulate with patient pickleball done some 2 pound air pickle ball.  Tolerated well.  Added to home program while she is on vacation.  And work on composite flexion extension wrist stretches.  Patient's PRWHE pain and function  score improved greatly.  Patient will follow-up with me in about 4 weeks after returning from vacation as well as recommend for her to play 1 pickleball practice with friends.  Patient limited in functional use of right dominant hand in ADLs and IADLs.  Patient can get benefit from skilled OT services to decrease edema and pain and increased motion and strength in right dominant hand and wrist patient to return to prior level of function.  PERFORMANCE DEFICITS: in functional skills including ADLs, IADLs, ROM, strength, pain, flexibility, decreased knowledge of use of DME, and UE functional use,   and psychosocial skills including environmental adaptation and routines and behaviors.   IMPAIRMENTS: are limiting patient from ADLs,  IADLs, rest and sleep, play, leisure, and social participation.   COMORBIDITIES: has no other co-morbidities that affects occupational performance. Patient will benefit from skilled OT to address above impairments and improve overall function.  MODIFICATION OR ASSISTANCE TO COMPLETE EVALUATION: No modification of tasks or assist necessary to complete an evaluation.  OT OCCUPATIONAL PROFILE AND HISTORY: Problem focused assessment: Including review of records relating to presenting problem.  CLINICAL DECISION MAKING: LOW - limited treatment options, no task modification necessary  REHAB POTENTIAL: Good for goals  EVALUATION COMPLEXITY: Low      PLAN:  OT FREQUENCY: 1-2x/week  OT DURATION: 8 wks  PLANNED INTERVENTIONS: 97168 OT Re-evaluation, 97535 self care/ADL training, 02889 therapeutic exercise, 97530 therapeutic activity, 97112 neuromuscular re-education, 97140 manual therapy, 97018 paraffin, 02960 fluidotherapy, 97034 contrast bath, 97760 Orthotic Initial, [id number] Orthotic/Prosthetic subsequent, passive range of motion, patient/family education, and DME and/or AE instructions    CONSULTED AND AGREED WITH PLAN OF CARE: Patient    Ancel Peters,  OTR/L,CLT 12/14/2024, 9:33 AM  0 "

## 2024-12-18 ENCOUNTER — Institutional Professional Consult (permissible substitution) (INDEPENDENT_AMBULATORY_CARE_PROVIDER_SITE_OTHER): Admitting: Adult Health

## 2024-12-19 ENCOUNTER — Ambulatory Visit: Admitting: Physical Therapy

## 2024-12-21 ENCOUNTER — Ambulatory Visit: Admitting: Physical Therapy

## 2025-01-04 ENCOUNTER — Ambulatory Visit: Admitting: Physical Therapy

## 2025-01-08 ENCOUNTER — Ambulatory Visit: Admitting: Occupational Therapy

## 2025-01-11 ENCOUNTER — Ambulatory Visit: Admitting: Physical Therapy

## 2025-01-18 ENCOUNTER — Ambulatory Visit: Admitting: Physical Therapy

## 2025-02-01 ENCOUNTER — Ambulatory Visit: Admitting: Physical Therapy

## 2025-05-18 ENCOUNTER — Ambulatory Visit: Admitting: Rheumatology
